# Patient Record
Sex: Female | Born: 1970
Health system: Southern US, Community
[De-identification: ages and names within clinical notes are randomized; demographics above are authoritative.]

## PROBLEM LIST (undated history)

## (undated) DIAGNOSIS — M2550 Pain in unspecified joint: Secondary | ICD-10-CM

## (undated) DIAGNOSIS — Z87898 Personal history of other specified conditions: Secondary | ICD-10-CM

## (undated) DIAGNOSIS — Z8669 Personal history of other diseases of the nervous system and sense organs: Secondary | ICD-10-CM

## (undated) DIAGNOSIS — Z973 Presence of spectacles and contact lenses: Secondary | ICD-10-CM

## (undated) DIAGNOSIS — R200 Anesthesia of skin: Secondary | ICD-10-CM

## (undated) DIAGNOSIS — J45909 Unspecified asthma, uncomplicated: Secondary | ICD-10-CM

## (undated) DIAGNOSIS — Z8619 Personal history of other infectious and parasitic diseases: Secondary | ICD-10-CM

## (undated) DIAGNOSIS — J302 Other seasonal allergic rhinitis: Secondary | ICD-10-CM

## (undated) DIAGNOSIS — K649 Unspecified hemorrhoids: Secondary | ICD-10-CM

## (undated) DIAGNOSIS — K6289 Other specified diseases of anus and rectum: Secondary | ICD-10-CM

## (undated) DIAGNOSIS — Z9889 Other specified postprocedural states: Secondary | ICD-10-CM

## (undated) DIAGNOSIS — D649 Anemia, unspecified: Secondary | ICD-10-CM

## (undated) DIAGNOSIS — R112 Nausea with vomiting, unspecified: Secondary | ICD-10-CM

## (undated) DIAGNOSIS — L719 Rosacea, unspecified: Secondary | ICD-10-CM

## (undated) DIAGNOSIS — Z98811 Dental restoration status: Secondary | ICD-10-CM

## (undated) HISTORY — DX: Anesthesia of skin: R20.0

## (undated) HISTORY — PX: WISDOM TOOTH EXTRACTION: SHX21

## (undated) HISTORY — PX: RETINAL LASER PROCEDURE: SHX2339

---

## 1996-01-15 DIAGNOSIS — Z8619 Personal history of other infectious and parasitic diseases: Secondary | ICD-10-CM

## 1996-01-15 HISTORY — DX: Personal history of other infectious and parasitic diseases: Z86.19

## 2002-02-09 ENCOUNTER — Other Ambulatory Visit: Admission: RE | Admit: 2002-02-09 | Discharge: 2002-02-09 | Payer: Self-pay | Admitting: Obstetrics and Gynecology

## 2002-03-03 ENCOUNTER — Encounter: Payer: Self-pay | Admitting: General Surgery

## 2002-03-03 ENCOUNTER — Encounter: Admission: RE | Admit: 2002-03-03 | Discharge: 2002-03-03 | Payer: Self-pay | Admitting: General Surgery

## 2002-12-08 ENCOUNTER — Other Ambulatory Visit: Admission: RE | Admit: 2002-12-08 | Discharge: 2002-12-08 | Payer: Self-pay | Admitting: Obstetrics and Gynecology

## 2003-07-01 ENCOUNTER — Encounter (INDEPENDENT_AMBULATORY_CARE_PROVIDER_SITE_OTHER): Payer: Self-pay

## 2003-07-01 ENCOUNTER — Inpatient Hospital Stay (HOSPITAL_COMMUNITY): Admission: AD | Admit: 2003-07-01 | Discharge: 2003-07-03 | Payer: Self-pay | Admitting: Obstetrics and Gynecology

## 2003-09-07 ENCOUNTER — Other Ambulatory Visit: Admission: RE | Admit: 2003-09-07 | Discharge: 2003-09-07 | Payer: Self-pay | Admitting: Obstetrics and Gynecology

## 2006-02-18 ENCOUNTER — Encounter: Admission: RE | Admit: 2006-02-18 | Discharge: 2006-02-18 | Payer: Self-pay | Admitting: Otolaryngology

## 2007-05-15 ENCOUNTER — Emergency Department (HOSPITAL_COMMUNITY): Admission: EM | Admit: 2007-05-15 | Discharge: 2007-05-15 | Payer: Self-pay | Admitting: Family Medicine

## 2009-12-12 ENCOUNTER — Encounter: Admission: RE | Admit: 2009-12-12 | Discharge: 2009-12-12 | Payer: Self-pay | Admitting: Obstetrics and Gynecology

## 2010-12-14 HISTORY — PX: DILATION AND CURETTAGE OF UTERUS: SHX78

## 2011-02-06 ENCOUNTER — Other Ambulatory Visit: Payer: Self-pay | Admitting: Obstetrics and Gynecology

## 2011-02-06 ENCOUNTER — Ambulatory Visit (HOSPITAL_COMMUNITY)
Admission: RE | Admit: 2011-02-06 | Payer: BC Managed Care – PPO | Source: Ambulatory Visit | Admitting: Obstetrics and Gynecology

## 2011-02-06 ENCOUNTER — Ambulatory Visit (HOSPITAL_BASED_OUTPATIENT_CLINIC_OR_DEPARTMENT_OTHER)
Admission: RE | Admit: 2011-02-06 | Discharge: 2011-02-06 | Disposition: A | Discharge status: Home / Self Care | Payer: BC Managed Care – PPO | Source: Ambulatory Visit | Attending: Obstetrics and Gynecology | Admitting: Obstetrics and Gynecology

## 2011-02-06 DIAGNOSIS — N949 Unspecified condition associated with female genital organs and menstrual cycle: Secondary | ICD-10-CM | POA: Insufficient documentation

## 2011-02-06 DIAGNOSIS — N938 Other specified abnormal uterine and vaginal bleeding: Secondary | ICD-10-CM | POA: Insufficient documentation

## 2011-02-06 LAB — CBC
MCH: 30.9 pg (ref 26.0–34.0)
MCHC: 33.1 g/dL (ref 30.0–36.0)
MCV: 93.5 fL (ref 78.0–100.0)
Platelets: 190 10*3/uL (ref 150–400)
RBC: 4.01 MIL/uL (ref 3.87–5.11)
RDW: 12.4 % (ref 11.5–15.5)

## 2011-02-06 LAB — DIFFERENTIAL
Basophils Relative: 0 % (ref 0–1)
Eosinophils Absolute: 0.1 10*3/uL (ref 0.0–0.7)
Eosinophils Relative: 1 % (ref 0–5)
Lymphs Abs: 2 10*3/uL (ref 0.7–4.0)
Monocytes Absolute: 0.5 10*3/uL (ref 0.1–1.0)
Monocytes Relative: 5 % (ref 3–12)

## 2011-02-16 ENCOUNTER — Other Ambulatory Visit: Payer: Self-pay | Admitting: Obstetrics and Gynecology

## 2011-02-16 DIAGNOSIS — Z1231 Encounter for screening mammogram for malignant neoplasm of breast: Secondary | ICD-10-CM

## 2011-04-27 ENCOUNTER — Ambulatory Visit
Admission: RE | Admit: 2011-04-27 | Discharge: 2011-04-27 | Disposition: A | Discharge status: Home / Self Care | Payer: BC Managed Care – PPO | Source: Ambulatory Visit | Attending: Obstetrics and Gynecology | Admitting: Obstetrics and Gynecology

## 2011-04-27 DIAGNOSIS — Z1231 Encounter for screening mammogram for malignant neoplasm of breast: Secondary | ICD-10-CM

## 2011-06-22 NOTE — Op Note (Signed)
  Aimee Cook, Aimee Cook             ACCOUNT NO.:  [id number]  MEDICAL RECORD NO.:  [id number]           PATIENT TYPE:  O  LOCATION:  SDC                           FACILITY:  [facility]  PHYSICIAN:  Maxie Better, M.D.DATE OF BIRTH:  [birth year]  DATE OF PROCEDURE:  02/06/2011 DATE OF DISCHARGE:                              OPERATIVE REPORT   PREOPERATIVE DIAGNOSIS:  Dysfunctional uterine bleeding.  PROCEDURE:  Diagnostic hysteroscopy, D and C.  POSTOPERATIVE DIAGNOSIS:  Dysfunctional uterine bleeding.  ANESTHESIA:  General, paracervical block.  SURGEON:  Maxie Better, M.D..  ASSISTANT:  None.  PROCEDURE:  Under adequate general anesthesia, the patient was placed in the dorsal lithotomy position.  She was sterilely prepped and draped in the usual fashion.  The patient has voided prior to entering the room. Examination under anesthesia revealed an anteflexed normal uterus.  No adnexal masses could be appreciated.  A bivalve speculum was placed in vagina.  The 10 mL of 1% Nesacaine was injected paracervical at the 3 and   9 o'clock position.  The anterior lip of the cervix was grasped with a single-toothed tenaculum.  The cervix was then sterilely dilated to #23 [facility] dilator.  A diagnostic hysteroscope was introduced into the uterine cavity without incident.  Both tubal ostia could be seen.  Just a slight prominent posterior wall endometrial thickening was noted, but otherwise no evidence of endometrial polyps.  The endocervical canal was without lesions.  The hysteroscope was removed.  The cava was then curetted.  The hysteroscope was then reinserted.  This was continued until an adequate sampling particularly of the posterior has been done, at which time, all instruments were then removed from the vagina. Specimen labeled endometrial curetting was sent to pathology.  Estimated blood loss was minimal.  Fluid deficit was 90 mL.  Sponge, instrument counts x2 was correct.   Complications were none.  The patient tolerated the procedure well and was transferred to the recovery room in stable condition.     Maxie Better, M.D.     [place]/MED[facility]  D:  02/06/2011  T:  02/06/2011  Job:  [id number]  Electronically Signed by Nena Jordan Ashla Murph M.D. on 02/10/2011 12:03:53 AM

## 2012-03-21 ENCOUNTER — Other Ambulatory Visit: Payer: Self-pay | Admitting: Obstetrics and Gynecology

## 2012-03-21 DIAGNOSIS — Z1231 Encounter for screening mammogram for malignant neoplasm of breast: Secondary | ICD-10-CM

## 2012-04-27 ENCOUNTER — Ambulatory Visit
Admission: RE | Admit: 2012-04-27 | Discharge: 2012-04-27 | Disposition: A | Discharge status: Home / Self Care | Payer: BC Managed Care – PPO | Source: Ambulatory Visit | Attending: Obstetrics and Gynecology | Admitting: Obstetrics and Gynecology

## 2012-04-27 DIAGNOSIS — Z1231 Encounter for screening mammogram for malignant neoplasm of breast: Secondary | ICD-10-CM

## 2012-09-07 DIAGNOSIS — Z23 Encounter for immunization: Secondary | ICD-10-CM

## 2013-03-08 ENCOUNTER — Other Ambulatory Visit: Payer: Self-pay

## 2013-03-08 DIAGNOSIS — Z1231 Encounter for screening mammogram for malignant neoplasm of breast: Secondary | ICD-10-CM

## 2013-03-16 ENCOUNTER — Other Ambulatory Visit: Payer: Self-pay | Admitting: Internal Medicine

## 2013-03-16 DIAGNOSIS — R1011 Right upper quadrant pain: Secondary | ICD-10-CM

## 2013-03-20 ENCOUNTER — Ambulatory Visit
Admission: RE | Admit: 2013-03-20 | Discharge: 2013-03-20 | Disposition: A | Discharge status: Home / Self Care | Payer: BC Managed Care – PPO | Source: Ambulatory Visit | Attending: Internal Medicine | Admitting: Internal Medicine

## 2013-03-20 DIAGNOSIS — R1011 Right upper quadrant pain: Secondary | ICD-10-CM

## 2013-04-28 ENCOUNTER — Ambulatory Visit
Admission: RE | Admit: 2013-04-28 | Discharge: 2013-04-28 | Disposition: A | Discharge status: Home / Self Care | Payer: BC Managed Care – PPO | Source: Ambulatory Visit

## 2013-04-28 DIAGNOSIS — Z1231 Encounter for screening mammogram for malignant neoplasm of breast: Secondary | ICD-10-CM

## 2014-08-29 ENCOUNTER — Ambulatory Visit (INDEPENDENT_AMBULATORY_CARE_PROVIDER_SITE_OTHER): Payer: BC Managed Care – PPO | Admitting: General Surgery

## 2015-03-18 IMAGING — US US ABDOMEN COMPLETE
1 series · 14 of 25 positions shown · non-contrast
Comparison: None.

CLINICAL DATA: Right upper quadrant pain.  Vomiting.

COMPLETE ABDOMINAL ULTRASOUND

[Series 1: us abdomen complete · 0.23mm/px · 14 of 92 slices shown]
[im 1/92]
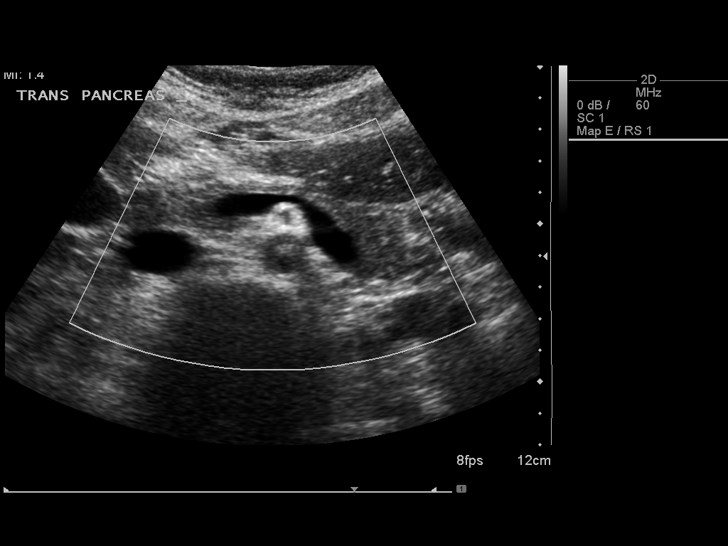
[im 8/92]
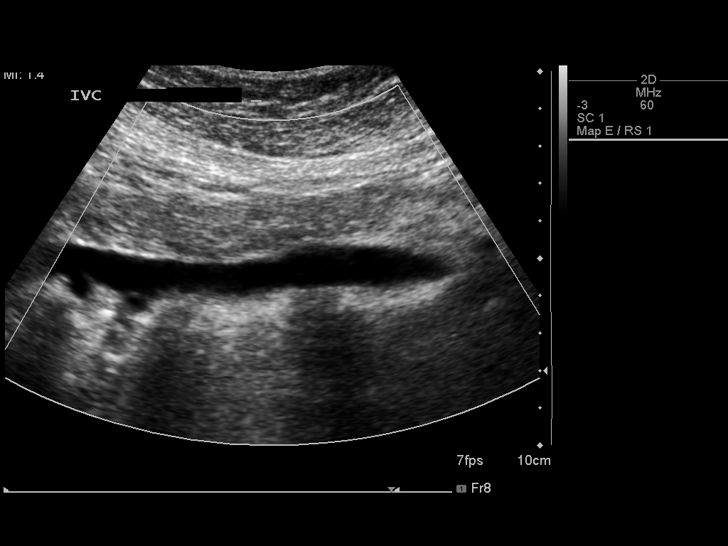
[im 16/92]
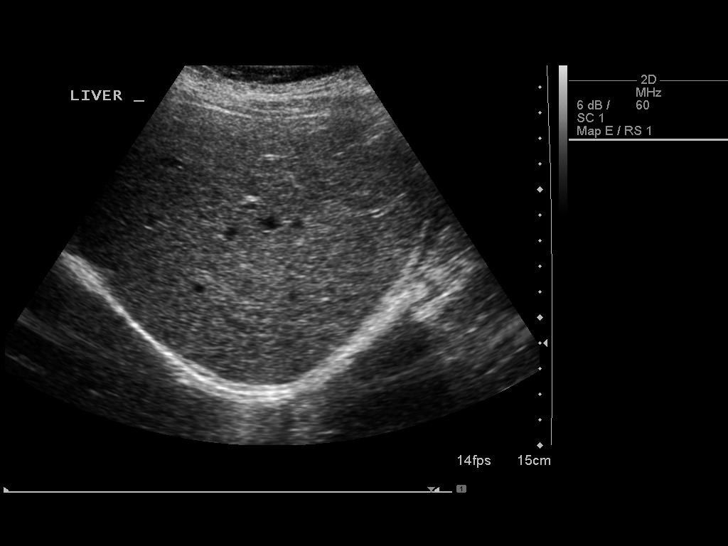
[im 23/92]
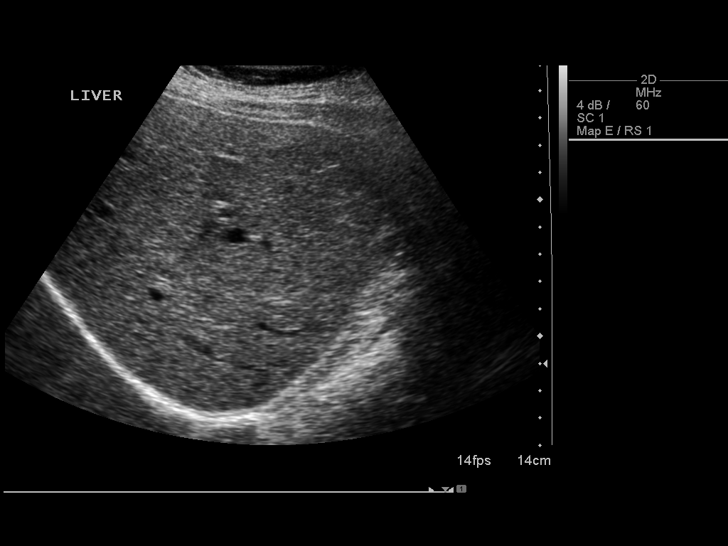
[im 31/92]
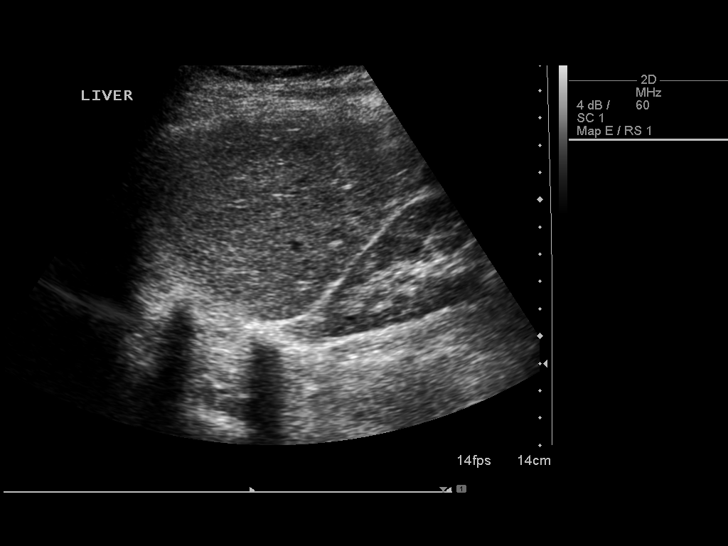
[im 35/92]
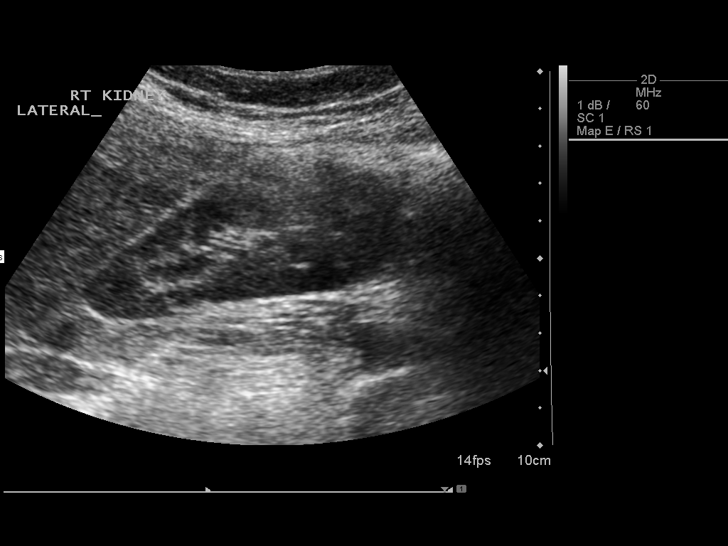
[im 42/92]
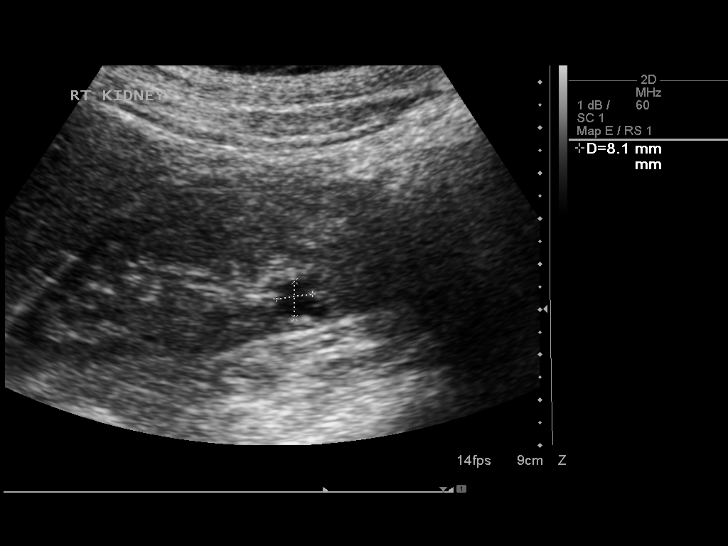
[im 50/92]
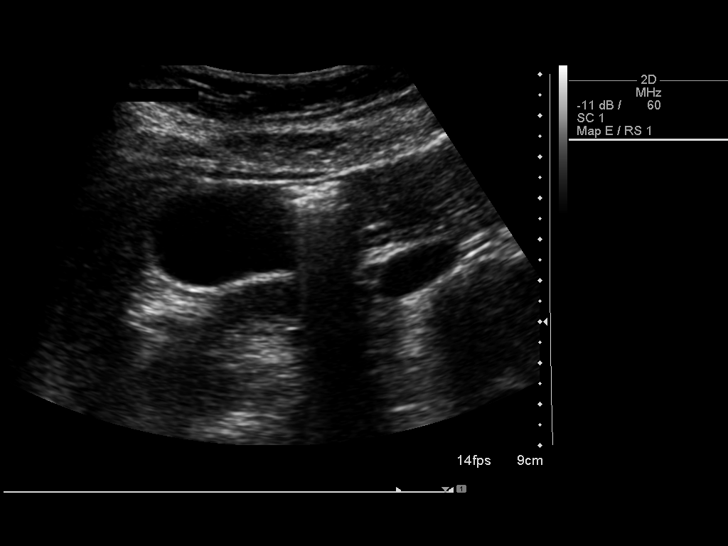
[im 57/92]
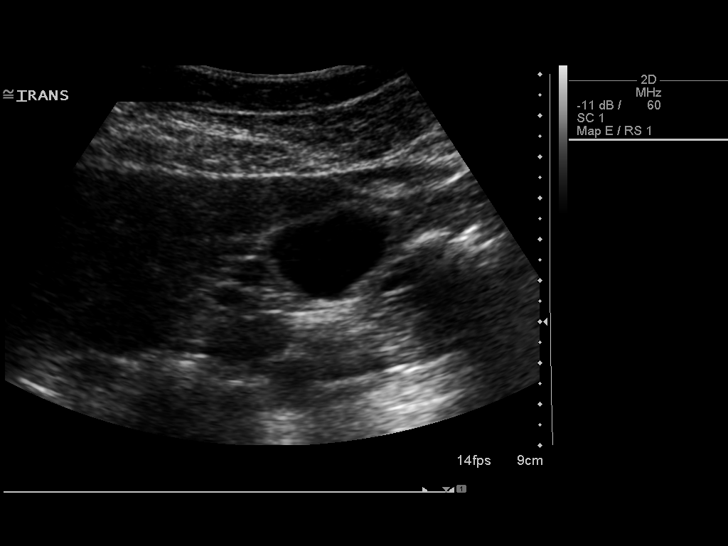
[im 61/92]
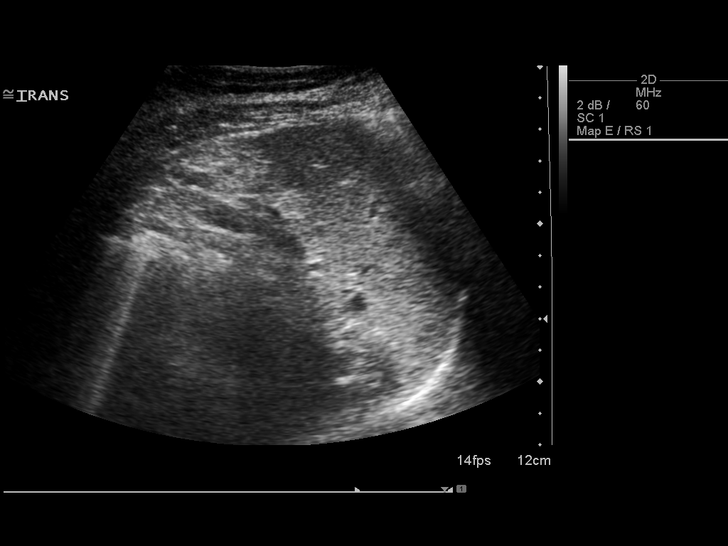
[im 69/92]
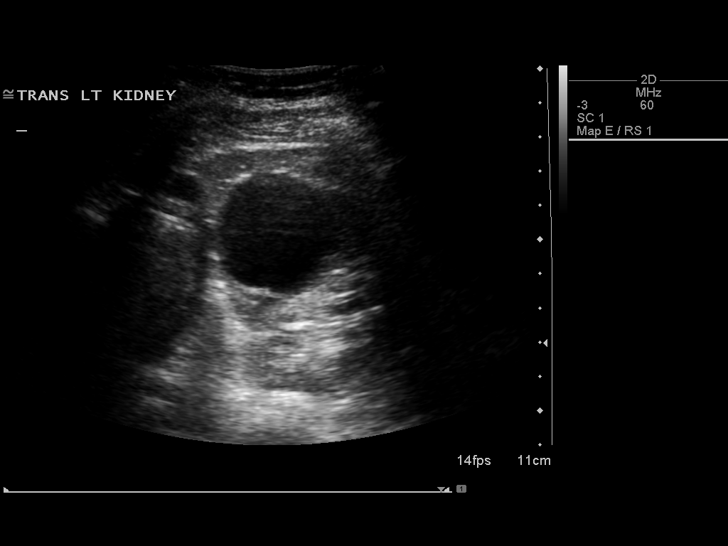
[im 76/92]
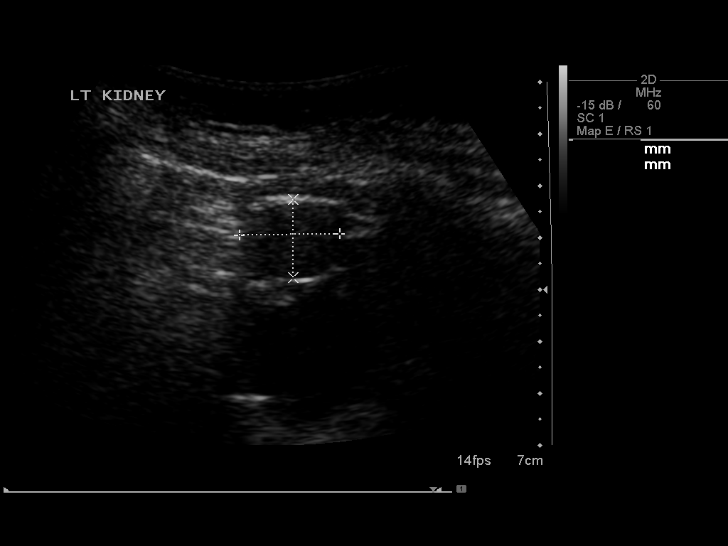
[im 84/92]
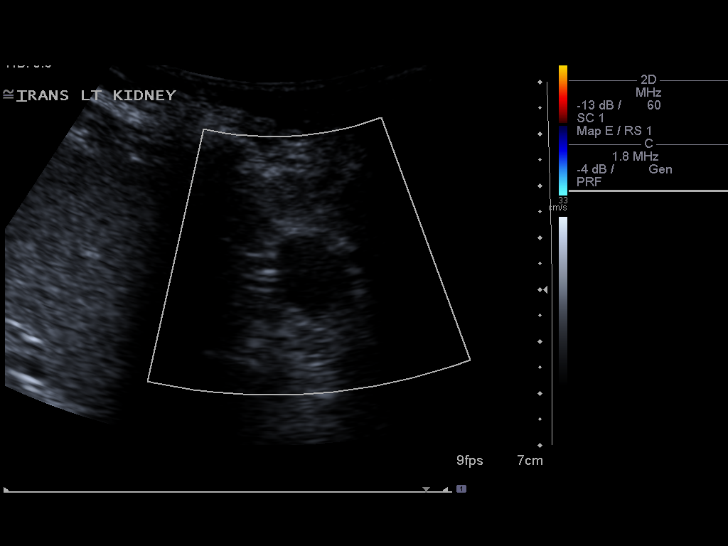
[im 92/92]
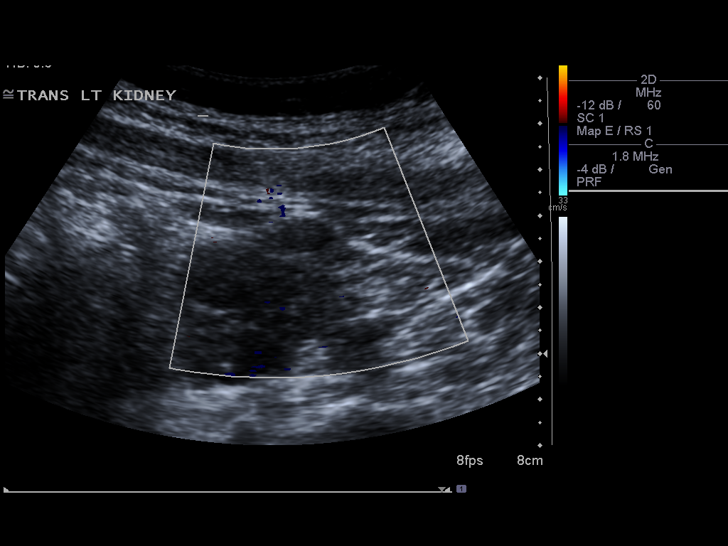

[14 of 25 positions shown; findings below may reference images not displayed]

FINDINGS: Gallbladder:  No gallstones, gallbladder wall thickening, or
pericholecystic fluid. Negative sonographic Murphy's sign.

Common bile duct:  Normal.  2.3 mm in diameter.

Liver:  Normal.

IVC:  Normal.

Pancreas:  Normal.

Spleen:  Normal.  4.6 cm in length.

Right Kidney:  10.3 cm in length.  8 mm cyst in the lower pole.

Left Kidney:  12.0 cm in length.  Multiple cysts including a 4.4 cm
cyst in the upper to midportion with focal calcification in the
posterior wall.  1.9 cm cyst anteriorly in the mid kidney.  2.1 cm
cyst in the mid kidney.

Abdominal aorta:  Normal.  1.8 cm maximum diameter.
IMPRESSION: No acute abnormalities.  Renal cysts.  One small calcification in
the upper pole cyst on the left.

## 2015-10-11 ENCOUNTER — Other Ambulatory Visit: Payer: Self-pay | Admitting: Neurological Surgery

## 2015-11-12 ENCOUNTER — Encounter (HOSPITAL_COMMUNITY): Payer: Self-pay

## 2015-11-12 ENCOUNTER — Encounter (HOSPITAL_COMMUNITY)
Admission: RE | Admit: 2015-11-12 | Discharge: 2015-11-12 | Disposition: A | Discharge status: Home / Self Care | Payer: BLUE CROSS/BLUE SHIELD | Source: Ambulatory Visit | Attending: Neurological Surgery | Admitting: Neurological Surgery

## 2015-11-12 DIAGNOSIS — Z01812 Encounter for preprocedural laboratory examination: Secondary | ICD-10-CM | POA: Insufficient documentation

## 2015-11-12 DIAGNOSIS — M4316 Spondylolisthesis, lumbar region: Secondary | ICD-10-CM | POA: Insufficient documentation

## 2015-11-12 DIAGNOSIS — Z0183 Encounter for blood typing: Secondary | ICD-10-CM | POA: Diagnosis not present

## 2015-11-12 HISTORY — DX: Presence of spectacles and contact lenses: Z97.3

## 2015-11-12 HISTORY — DX: Rosacea, unspecified: L71.9

## 2015-11-12 HISTORY — DX: Unspecified asthma, uncomplicated: J45.909

## 2015-11-12 HISTORY — DX: Dental restoration status: Z98.811

## 2015-11-12 HISTORY — DX: Other seasonal allergic rhinitis: J30.2

## 2015-11-12 HISTORY — DX: Anemia, unspecified: D64.9

## 2015-11-12 HISTORY — DX: Pain in unspecified joint: M25.50

## 2015-11-12 HISTORY — DX: Personal history of other infectious and parasitic diseases: Z86.19

## 2015-11-12 HISTORY — DX: Unspecified hemorrhoids: K64.9

## 2015-11-12 LAB — CBC
HCT: 38.5 % (ref 36.0–46.0)
Hemoglobin: 12.9 g/dL (ref 12.0–15.0)
MCH: 31.4 pg (ref 26.0–34.0)
MCHC: 33.5 g/dL (ref 30.0–36.0)
MCV: 93.7 fL (ref 78.0–100.0)
PLATELETS: 185 10*3/uL (ref 150–400)
RBC: 4.11 MIL/uL (ref 3.87–5.11)
RDW: 12.3 % (ref 11.5–15.5)
WBC: 5.4 10*3/uL (ref 4.0–10.5)

## 2015-11-12 LAB — TYPE AND SCREEN
ABO/RH(D): O POS
Antibody Screen: NEGATIVE

## 2015-11-12 LAB — BASIC METABOLIC PANEL
ANION GAP: 6 (ref 5–15)
BUN: 5 mg/dL — ABNORMAL LOW (ref 6–20)
CALCIUM: 10.1 mg/dL (ref 8.9–10.3)
CO2: 28 mmol/L (ref 22–32)
CREATININE: 0.61 mg/dL (ref 0.44–1.00)
Chloride: 106 mmol/L (ref 101–111)
GFR calc Af Amer: >60 mL/min (ref >60)
GLUCOSE: 82 mg/dL (ref 65–99)
Potassium: 3.7 mmol/L (ref 3.5–5.1)
Sodium: 140 mmol/L (ref 135–145)

## 2015-11-12 LAB — SURGICAL PCR SCREEN
MRSA, PCR: NEGATIVE
Staphylococcus aureus: NEGATIVE

## 2015-11-12 LAB — HCG, SERUM, QUALITATIVE: PREG SERUM: NEGATIVE

## 2015-11-12 LAB — ABO/RH: ABO/RH(D): O POS

## 2015-11-12 NOTE — Pre-Procedure Instructions (Signed)
    Sherrye PayorJennifer L J Hailu  11/12/2015      [facility] [id number] [place], [place] - [address] AT [facility] [address] [place] [id number] Phone: [phone] Fax: [phone]    Your procedure is scheduled on Monday, December 5th, 2016.  [facility] Admitting at 9:00 A.M.  Call this number if you have problems the morning of surgery:  [phone]   Remember:  Do not eat food or drink liquids after midnight.   Take these medicines the morning of surgery with A SIP OF WATER: Beclomethasone (QVAR) inhaler, Restasis eye drop, Fluticasone (Flonase) if needed.  Stop taking: Aspirin, NSAIDS, Aleve, Naproxen, Ibuprofen, Motrin, Advil, BC's, Goody's, fish oil, all herbal medications, and all vitamins.    Do not wear jewelry, make-up or nail polish.  Do not wear lotions, powders, or perfumes.  You may NOT wear deodorant.  Do not shave 48 hours prior to surgery.    [facility] is not responsible for any belongings or valuables.  Contacts, dentures or bridgework may not be worn into surgery.  Leave your suitcase in the car.  After surgery it may be brought to your room.  For patients admitted to the hospital, discharge time will be determined by your treatment team.  Patients discharged the day of surgery will not be allowed to drive home.   Special instructions:  See attached.   Please read over the following fact sheets that you were given. Pain Booklet, Coughing and Deep Breathing, Blood Transfusion Information, MRSA Information and Surgical Site Infection Prevention

## 2015-11-12 NOTE — Progress Notes (Signed)
PCP -Dr. Timothy Lassousso Cardiologist - denies  EKG/ CXR - denies  Echo/stress test/cardiac cath - denies  Patient denies chest pain and shortness of breath at PAT appointment.

## 2015-11-14 HISTORY — PX: SPINAL FUSION: SHX223

## 2015-11-24 MED ORDER — CEFAZOLIN SODIUM-DEXTROSE 2-3 GM-% IV SOLR
2.0000 g | INTRAVENOUS | Status: AC
  Administered 2015-11-25: 2 g via INTRAVENOUS
  Filled 2015-11-24: qty 50

## 2015-11-25 ENCOUNTER — Inpatient Hospital Stay (HOSPITAL_COMMUNITY): Payer: BLUE CROSS/BLUE SHIELD

## 2015-11-25 ENCOUNTER — Inpatient Hospital Stay (HOSPITAL_COMMUNITY): Payer: BLUE CROSS/BLUE SHIELD | Admitting: Anesthesiology

## 2015-11-25 ENCOUNTER — Encounter (HOSPITAL_COMMUNITY): Payer: Self-pay | Admitting: *Deleted

## 2015-11-25 ENCOUNTER — Encounter (HOSPITAL_COMMUNITY)
Admission: RE | Disposition: A | Discharge status: Home / Self Care | Payer: BLUE CROSS/BLUE SHIELD | Source: Ambulatory Visit | Attending: Neurological Surgery

## 2015-11-25 ENCOUNTER — Inpatient Hospital Stay (HOSPITAL_COMMUNITY)
Admission: RE | Admit: 2015-11-25 | Discharge: 2015-11-27 | DRG: 460 | Disposition: A | Discharge status: Home / Self Care | Payer: BLUE CROSS/BLUE SHIELD | Source: Ambulatory Visit | Attending: Neurological Surgery | Admitting: Neurological Surgery

## 2015-11-25 DIAGNOSIS — Q763 Congenital scoliosis due to congenital bony malformation: Secondary | ICD-10-CM

## 2015-11-25 DIAGNOSIS — M4316 Spondylolisthesis, lumbar region: Secondary | ICD-10-CM | POA: Diagnosis present

## 2015-11-25 DIAGNOSIS — J45909 Unspecified asthma, uncomplicated: Secondary | ICD-10-CM | POA: Diagnosis present

## 2015-11-25 DIAGNOSIS — Z419 Encounter for procedure for purposes other than remedying health state, unspecified: Secondary | ICD-10-CM

## 2015-11-25 DIAGNOSIS — Q762 Congenital spondylolisthesis: Principal | ICD-10-CM

## 2015-11-25 DIAGNOSIS — Z888 Allergy status to other drugs, medicaments and biological substances status: Secondary | ICD-10-CM | POA: Diagnosis not present

## 2015-11-25 DIAGNOSIS — Z886 Allergy status to analgesic agent status: Secondary | ICD-10-CM | POA: Diagnosis not present

## 2015-11-25 DIAGNOSIS — M5416 Radiculopathy, lumbar region: Secondary | ICD-10-CM | POA: Diagnosis present

## 2015-11-25 DIAGNOSIS — Z883 Allergy status to other anti-infective agents status: Secondary | ICD-10-CM | POA: Diagnosis not present

## 2015-11-25 SURGERY — POSTERIOR LUMBAR FUSION 1 LEVEL
Anesthesia: General | Site: Back

## 2015-11-25 MED ORDER — CYCLOSPORINE 0.05 % OP EMUL
1.0000 [drp] | Freq: Two times a day (BID) | OPHTHALMIC | Status: DC
  Filled 2015-11-25 (×5): qty 1

## 2015-11-25 MED ORDER — PHENYLEPHRINE 40 MCG/ML (10ML) SYRINGE FOR IV PUSH (FOR BLOOD PRESSURE SUPPORT)
PREFILLED_SYRINGE | INTRAVENOUS | Status: AC
  Filled 2015-11-25: qty 10

## 2015-11-25 MED ORDER — THROMBIN 20000 UNITS EX SOLR
CUTANEOUS | Status: DC | PRN
  Administered 2015-11-25: 18:00:00 via TOPICAL

## 2015-11-25 MED ORDER — ONDANSETRON HCL 4 MG/2ML IJ SOLN
4.0000 mg | INTRAMUSCULAR | Status: DC | PRN
  Administered 2015-11-25 – 2015-11-26 (×3): 4 mg via INTRAVENOUS
  Filled 2015-11-25 (×3): qty 2

## 2015-11-25 MED ORDER — DEXAMETHASONE SODIUM PHOSPHATE 4 MG/ML IJ SOLN
INTRAMUSCULAR | Status: AC
  Filled 2015-11-25: qty 2

## 2015-11-25 MED ORDER — MENTHOL 3 MG MT LOZG: 1.0000 | LOZENGE | OROMUCOSAL | Status: DC | PRN

## 2015-11-25 MED ORDER — MIDAZOLAM HCL 2 MG/2ML IJ SOLN
INTRAMUSCULAR | Status: AC
  Filled 2015-11-25: qty 2

## 2015-11-25 MED ORDER — HYDROMORPHONE HCL 1 MG/ML IJ SOLN
INTRAMUSCULAR | Status: AC
  Administered 2015-11-25: 0.5 mg via INTRAVENOUS
  Filled 2015-11-25: qty 1

## 2015-11-25 MED ORDER — KETOROLAC TROMETHAMINE 15 MG/ML IJ SOLN
15.0000 mg | Freq: Four times a day (QID) | INTRAMUSCULAR | Status: AC
  Administered 2015-11-26 (×5): 15 mg via INTRAVENOUS
  Filled 2015-11-25 (×4): qty 1

## 2015-11-25 MED ORDER — HYDROMORPHONE HCL 1 MG/ML IJ SOLN
0.5000 mg | INTRAMUSCULAR | Status: DC | PRN
  Administered 2015-11-26: 1 mg via INTRAVENOUS
  Filled 2015-11-25: qty 1

## 2015-11-25 MED ORDER — LIDOCAINE HCL (CARDIAC) 20 MG/ML IV SOLN
INTRAVENOUS | Status: DC | PRN
  Administered 2015-11-25: 60 mg via INTRAVENOUS

## 2015-11-25 MED ORDER — FENTANYL CITRATE (PF) 250 MCG/5ML IJ SOLN
INTRAMUSCULAR | Status: AC
  Filled 2015-11-25: qty 5

## 2015-11-25 MED ORDER — RISAQUAD PO CAPS
1.0000 | ORAL_CAPSULE | Freq: Every day | ORAL | Status: DC
  Filled 2015-11-25 (×3): qty 1

## 2015-11-25 MED ORDER — ACETAMINOPHEN 650 MG RE SUPP: 650.0000 mg | RECTAL | Status: DC | PRN

## 2015-11-25 MED ORDER — ROCURONIUM BROMIDE 100 MG/10ML IV SOLN
INTRAVENOUS | Status: DC | PRN
  Administered 2015-11-25: 50 mg via INTRAVENOUS

## 2015-11-25 MED ORDER — METHOCARBAMOL 1000 MG/10ML IJ SOLN
500.0000 mg | Freq: Four times a day (QID) | INTRAMUSCULAR | Status: DC | PRN
  Filled 2015-11-25: qty 5

## 2015-11-25 MED ORDER — ONDANSETRON HCL 4 MG/2ML IJ SOLN
INTRAMUSCULAR | Status: DC | PRN
  Administered 2015-11-25 (×2): 4 mg via INTRAVENOUS

## 2015-11-25 MED ORDER — CEFAZOLIN SODIUM 1-5 GM-% IV SOLN
1.0000 g | Freq: Three times a day (TID) | INTRAVENOUS | Status: AC
  Administered 2015-11-25 – 2015-11-26 (×2): 1 g via INTRAVENOUS
  Filled 2015-11-25 (×2): qty 50

## 2015-11-25 MED ORDER — LACTATED RINGERS IV SOLN
INTRAVENOUS | Status: DC
  Administered 2015-11-25 (×3): via INTRAVENOUS

## 2015-11-25 MED ORDER — BISACODYL 10 MG RE SUPP: 10.0000 mg | Freq: Every day | RECTAL | Status: DC | PRN

## 2015-11-25 MED ORDER — PROBIOTIC PO CAPS: ORAL_CAPSULE | Freq: Every day | ORAL | Status: DC

## 2015-11-25 MED ORDER — 0.9 % SODIUM CHLORIDE (POUR BTL) OPTIME
TOPICAL | Status: DC | PRN
  Administered 2015-11-25: 1000 mL

## 2015-11-25 MED ORDER — SODIUM CHLORIDE 0.9 % IV SOLN: 250.0000 mL | INTRAVENOUS | Status: DC

## 2015-11-25 MED ORDER — DEXAMETHASONE SODIUM PHOSPHATE 10 MG/ML IJ SOLN
INTRAMUSCULAR | Status: DC | PRN
  Administered 2015-11-25: 10 mg via INTRAVENOUS

## 2015-11-25 MED ORDER — SODIUM CHLORIDE 0.9 % IJ SOLN
3.0000 mL | Freq: Two times a day (BID) | INTRAMUSCULAR | Status: DC
  Administered 2015-11-26 (×2): 3 mL via INTRAVENOUS

## 2015-11-25 MED ORDER — ALUM & MAG HYDROXIDE-SIMETH 200-200-20 MG/5ML PO SUSP: 30.0000 mL | Freq: Four times a day (QID) | ORAL | Status: DC | PRN

## 2015-11-25 MED ORDER — SODIUM CHLORIDE 0.9 % IR SOLN
Status: DC | PRN
  Administered 2015-11-25: 18:00:00

## 2015-11-25 MED ORDER — MIDAZOLAM HCL 5 MG/5ML IJ SOLN
INTRAMUSCULAR | Status: DC | PRN
  Administered 2015-11-25: 2 mg via INTRAVENOUS

## 2015-11-25 MED ORDER — ROCURONIUM BROMIDE 50 MG/5ML IV SOLN
INTRAVENOUS | Status: AC
  Filled 2015-11-25: qty 1

## 2015-11-25 MED ORDER — SODIUM CHLORIDE 0.9 % IJ SOLN: 3.0000 mL | INTRAMUSCULAR | Status: DC | PRN

## 2015-11-25 MED ORDER — ONDANSETRON HCL 4 MG/2ML IJ SOLN
INTRAMUSCULAR | Status: AC
  Filled 2015-11-25: qty 2

## 2015-11-25 MED ORDER — CALCIUM POLYCARBOPHIL 625 MG PO TABS
1250.0000 mg | ORAL_TABLET | Freq: Every day | ORAL | Status: DC
  Filled 2015-11-25 (×3): qty 2

## 2015-11-25 MED ORDER — LIDOCAINE-EPINEPHRINE 1 %-1:100000 IJ SOLN
INTRAMUSCULAR | Status: DC | PRN
  Administered 2015-11-25: 10 mL

## 2015-11-25 MED ORDER — OXYCODONE-ACETAMINOPHEN 5-325 MG PO TABS
1.0000 | ORAL_TABLET | ORAL | Status: DC | PRN
  Administered 2015-11-25 – 2015-11-27 (×7): 2 via ORAL
  Filled 2015-11-25 (×8): qty 2

## 2015-11-25 MED ORDER — LIDOCAINE HCL (CARDIAC) 20 MG/ML IV SOLN
INTRAVENOUS | Status: AC
  Filled 2015-11-25: qty 5

## 2015-11-25 MED ORDER — TACROLIMUS 0.1 % EX OINT
1.0000 "application " | TOPICAL_OINTMENT | Freq: Every day | CUTANEOUS | Status: DC
  Administered 2015-11-25: 1 via TOPICAL

## 2015-11-25 MED ORDER — BUDESONIDE 0.25 MG/2ML IN SUSP
0.2500 mg | Freq: Two times a day (BID) | RESPIRATORY_TRACT | Status: DC
Start: 2015-11-25 — End: 2015-11-27
  Filled 2015-11-25 (×6): qty 2

## 2015-11-25 MED ORDER — FLUTICASONE PROPIONATE 50 MCG/ACT NA SUSP
1.0000 | Freq: Every day | NASAL | Status: DC | PRN
  Filled 2015-11-25: qty 16

## 2015-11-25 MED ORDER — PHENYLEPHRINE HCL 10 MG/ML IJ SOLN
INTRAMUSCULAR | Status: DC | PRN
  Administered 2015-11-25 (×2): 40 ug via INTRAVENOUS
  Administered 2015-11-25 (×2): 80 ug via INTRAVENOUS

## 2015-11-25 MED ORDER — THROMBIN 5000 UNITS EX SOLR
OROMUCOSAL | Status: DC | PRN
  Administered 2015-11-25: 18:00:00 via TOPICAL

## 2015-11-25 MED ORDER — DOCUSATE SODIUM 100 MG PO CAPS
100.0000 mg | ORAL_CAPSULE | Freq: Two times a day (BID) | ORAL | Status: DC
  Administered 2015-11-25 – 2015-11-26 (×2): 100 mg via ORAL
  Filled 2015-11-25 (×2): qty 1

## 2015-11-25 MED ORDER — ACETAMINOPHEN 325 MG PO TABS: 650.0000 mg | ORAL_TABLET | ORAL | Status: DC | PRN

## 2015-11-25 MED ORDER — FENTANYL CITRATE (PF) 100 MCG/2ML IJ SOLN
INTRAMUSCULAR | Status: DC | PRN
  Administered 2015-11-25: 25 ug via INTRAVENOUS
  Administered 2015-11-25: 100 ug via INTRAVENOUS

## 2015-11-25 MED ORDER — HYDROCODONE-ACETAMINOPHEN 5-325 MG PO TABS: 1.0000 | ORAL_TABLET | ORAL | Status: DC | PRN

## 2015-11-25 MED ORDER — POLYETHYLENE GLYCOL 3350 17 G PO PACK: 17.0000 g | PACK | Freq: Every day | ORAL | Status: DC | PRN

## 2015-11-25 MED ORDER — PHENOL 1.4 % MT LIQD: 1.0000 | OROMUCOSAL | Status: DC | PRN

## 2015-11-25 MED ORDER — SENNA 8.6 MG PO TABS
1.0000 | ORAL_TABLET | Freq: Two times a day (BID) | ORAL | Status: DC
  Administered 2015-11-25 – 2015-11-26 (×2): 8.6 mg via ORAL
  Filled 2015-11-25 (×2): qty 1

## 2015-11-25 MED ORDER — PROPOFOL 10 MG/ML IV BOLUS
INTRAVENOUS | Status: AC
  Filled 2015-11-25: qty 20

## 2015-11-25 MED ORDER — SODIUM CHLORIDE 0.9 % IV SOLN: INTRAVENOUS | Status: DC

## 2015-11-25 MED ORDER — METHOCARBAMOL 500 MG PO TABS
500.0000 mg | ORAL_TABLET | Freq: Four times a day (QID) | ORAL | Status: DC | PRN
  Administered 2015-11-25 – 2015-11-27 (×5): 500 mg via ORAL
  Filled 2015-11-25 (×5): qty 1

## 2015-11-25 MED ORDER — BUPIVACAINE HCL (PF) 0.5 % IJ SOLN
INTRAMUSCULAR | Status: DC | PRN
  Administered 2015-11-25: 20 mL
  Administered 2015-11-25: 10 mL

## 2015-11-25 MED ORDER — HYDROMORPHONE HCL 1 MG/ML IJ SOLN
0.2500 mg | INTRAMUSCULAR | Status: DC | PRN
  Administered 2015-11-25 (×2): 0.5 mg via INTRAVENOUS

## 2015-11-25 MED ORDER — PROPOFOL 10 MG/ML IV BOLUS
INTRAVENOUS | Status: DC | PRN
  Administered 2015-11-25: 120 mg via INTRAVENOUS

## 2015-11-25 SURGICAL SUPPLY — 66 items
BAG DECANTER FOR FLEXI CONT (MISCELLANEOUS) ×2 IMPLANT
BLADE CLIPPER SURG (BLADE) IMPLANT
BONE CANC CHIPS 40CC CAN1/2 (Bone Implant) ×2 IMPLANT
BUR MATCHSTICK NEURO 3.0 LAGG (BURR) ×2 IMPLANT
CAGE COROENT LRG 9X9X28-8 (Cage) ×4 IMPLANT
CANISTER SUCT 3000ML PPV (MISCELLANEOUS) ×2 IMPLANT
CHIPS CANC BONE 40CC CAN1/2 (Bone Implant) ×1 IMPLANT
CONT SPEC 4OZ CLIKSEAL STRL BL (MISCELLANEOUS) ×2 IMPLANT
COVER BACK TABLE 60X90IN (DRAPES) ×2 IMPLANT
DECANTER SPIKE VIAL GLASS SM (MISCELLANEOUS) ×2 IMPLANT
DERMABOND ADVANCED (GAUZE/BANDAGES/DRESSINGS) ×1
DERMABOND ADVANCED .7 DNX12 (GAUZE/BANDAGES/DRESSINGS) ×1 IMPLANT
DEVICE THRD PEDIGUARD 2.5MM XS (MISCELLANEOUS) ×1 IMPLANT
DRAPE C-ARM 42X72 X-RAY (DRAPES) ×4 IMPLANT
DRAPE LAPAROTOMY 100X72X124 (DRAPES) ×2 IMPLANT
DRAPE POUCH INSTRU U-SHP 10X18 (DRAPES) ×2 IMPLANT
DRAPE PROXIMA HALF (DRAPES) IMPLANT
DRSG OPSITE POSTOP 4X6 (GAUZE/BANDAGES/DRESSINGS) ×2 IMPLANT
DURAPREP 26ML APPLICATOR (WOUND CARE) IMPLANT
ELECT REM PT RETURN 9FT ADLT (ELECTROSURGICAL) ×2
ELECTRODE REM PT RTRN 9FT ADLT (ELECTROSURGICAL) ×1 IMPLANT
GAUZE SPONGE 4X4 12PLY STRL (GAUZE/BANDAGES/DRESSINGS) ×2 IMPLANT
GAUZE SPONGE 4X4 16PLY XRAY LF (GAUZE/BANDAGES/DRESSINGS) IMPLANT
GLOVE BIOGEL PI IND STRL 8.5 (GLOVE) ×2 IMPLANT
GLOVE BIOGEL PI INDICATOR 8.5 (GLOVE) ×2
GLOVE ECLIPSE 8.5 STRL (GLOVE) ×4 IMPLANT
GLOVE EXAM NITRILE LRG STRL (GLOVE) IMPLANT
GLOVE EXAM NITRILE MD LF STRL (GLOVE) IMPLANT
GLOVE EXAM NITRILE XL STR (GLOVE) IMPLANT
GLOVE EXAM NITRILE XS STR PU (GLOVE) IMPLANT
GOWN STRL REUS W/ TWL LRG LVL3 (GOWN DISPOSABLE) IMPLANT
GOWN STRL REUS W/ TWL XL LVL3 (GOWN DISPOSABLE) IMPLANT
GOWN STRL REUS W/TWL 2XL LVL3 (GOWN DISPOSABLE) ×4 IMPLANT
GOWN STRL REUS W/TWL LRG LVL3 (GOWN DISPOSABLE)
GOWN STRL REUS W/TWL XL LVL3 (GOWN DISPOSABLE)
HEMOSTAT POWDER KIT SURGIFOAM (HEMOSTASIS) IMPLANT
HEMOSTAT POWDER SURGIFOAM 1G (HEMOSTASIS) ×2 IMPLANT
KIT BASIN OR (CUSTOM PROCEDURE TRAY) ×2 IMPLANT
KIT ROOM TURNOVER OR (KITS) ×2 IMPLANT
MILL MEDIUM DISP (BLADE) ×2 IMPLANT
MODULE POWER NUVASIVE (MISCELLANEOUS) ×1 IMPLANT
NEEDLE HYPO 22GX1.5 SAFETY (NEEDLE) ×2 IMPLANT
NEEDLE SPNL 22GX3.5 QUINCKE BK (NEEDLE) ×2 IMPLANT
NS IRRIG 1000ML POUR BTL (IV SOLUTION) ×2 IMPLANT
PACK LAMINECTOMY NEURO (CUSTOM PROCEDURE TRAY) ×2 IMPLANT
PAD ARMBOARD 7.5X6 YLW CONV (MISCELLANEOUS) ×6 IMPLANT
PATTIES SURGICAL .5 X1 (DISPOSABLE) ×2 IMPLANT
PEDIGUARD CLASSIC 2.5MM XS (MISCELLANEOUS) ×2
POWER MODULE NUVASIVE (MISCELLANEOUS) ×2
ROD RELINE-O LORD 5.5X35MM (Rod) ×4 IMPLANT
SCREW LOCK RELINE 5.5 TULIP (Screw) ×8 IMPLANT
SCREW RELINE-O POLY 6.5X40 (Screw) ×2 IMPLANT
SCREW RELINE-O POLY 6.5X45 (Screw) ×2 IMPLANT
SCREW RELINE-O POLY 6.5X50MM (Screw) ×4 IMPLANT
SPONGE LAP 4X18 X RAY DECT (DISPOSABLE) IMPLANT
SPONGE SURGIFOAM ABS GEL 100 (HEMOSTASIS) ×2 IMPLANT
SUT VIC AB 1 CT1 18XBRD ANBCTR (SUTURE) ×2 IMPLANT
SUT VIC AB 1 CT1 8-18 (SUTURE) ×2
SUT VIC AB 2-0 CP2 18 (SUTURE) ×4 IMPLANT
SUT VIC AB 3-0 SH 8-18 (SUTURE) ×2 IMPLANT
SYR 3ML LL SCALE MARK (SYRINGE) ×8 IMPLANT
TOWEL OR 17X24 6PK STRL BLUE (TOWEL DISPOSABLE) ×2 IMPLANT
TOWEL OR 17X26 10 PK STRL BLUE (TOWEL DISPOSABLE) ×2 IMPLANT
TRAP SPECIMEN MUCOUS 40CC (MISCELLANEOUS) ×2 IMPLANT
TRAY FOLEY W/METER SILVER 14FR (SET/KITS/TRAYS/PACK) ×2 IMPLANT
WATER STERILE IRR 1000ML POUR (IV SOLUTION) ×2 IMPLANT

## 2015-11-25 NOTE — Op Note (Signed)
Date of surgery: 11/25/2015 Preoperative diagnosis: L5-S1 spondylolisthesis with radiculopathy Postoperative diagnosis: Same Surgery: L5-S1 decompression with decompression of L5 and S1 nerve roots, posterior lumbar interbody arthrodesis with peek spacers local autograft and allograft, pedicle screw fixation L5-S1, posterior lateral arthrodesis L5-S1. Surgeon: Barnett AbuHenry Estha Few First Asst.: Cherrie DistanceBenjamin ditty M.D. Anesthesia: Gen. endotracheal Indications: Aimee FermoJennifer Grabbe is 44 year old individual's had significant back and bilateral lower extremity pain she has a grade 2 spondylolisthesis at L5-S1 that she's been aware of for 20 years. She is treated in all manner of conservative interventions and has been diligent with physical therapy and yoga. Despite this her symptoms have continued to worsen. She has had chronic radiculopathy now with severe episodes every several months. She desires to undergo surgical intervention to stabilize her spine.  Procedure: The patient was brought to the operating room supine on a stretcher. After the smooth induction of general endotracheal anesthesia, she was turned prone. The back was prepped with alcohol and DuraPrep and draped in a sterile fashion. A midline incision was created in the lumbosacral region this was carried down to the lumbar dorsal fascia. The fascia was opened on either side of midline, the underlying tissues were exposed. The laminar arch of L5 and the spinous process was immediately evident as being loose. This was dissected and the laminar arch was removed. This allowed good evaluation and decompression of the common dural tube and then the L5 nerve root superiorly and the S1 nerve root inferiorly. Once this was performed the disc space was isolated. A total discectomy was then performed on a bilateral approach removing a substantial quantity of severely degenerated and desiccated disc material though the endplate material still seems somewhat healthy. The  endplates were removed and the bone was curettaged fully. The interspace was then sized for a 10 mm tall 8 lordotic 28 mm long spacer. These were filled with accommodation of the patient's own bone from the laminectomy at L5 in addition allograft in the form of cancellus croutons. A total of 12 mL of bone graft was packed into the interspace along with the 2 cages. Then pedicle entry sites were chosen at L5 and S1. Lateral gutters were decompressed and decorticated and made ready for grafting. Another 9 mL of the allograft autograft mixture was packed into the lateral gutters between the sacral alar and transverse process of L5. Pedicle screws were then placed using 6.5 x 50 mm screws at L5 and 6.5 x 45 mm screws in the sacrum on one side and 6.5 x 40 mm screw on the other side. Short 40 mm rods were used to connect the screw heads together. With the lateral gutters being packed in the area was inspected carefully to make sure that the L5 and the S1 nerve roots were well decompressed and hemostasis in the epidural space was well maintained. Once this was assured final radiographs were obtained in AP and lateral projection and then the retractor was removed and the lumbar dorsal fascia was closed with #1 Vicryls in 20 Vicryls use of subcutaneous tissues and 30 Vicryls subcuticularly. Blood loss is estimated at less than 200 mL. Patient tolerated procedure well.

## 2015-11-25 NOTE — Anesthesia Preprocedure Evaluation (Addendum)
Anesthesia Evaluation  Patient identified by MRN, date of birth, ID band Patient awake    Reviewed: Allergy & Precautions, H&P , NPO status , Patient's Chart, lab work & pertinent test results  Airway Mallampati: II  TM Distance: >3 FB Neck ROM: Full    Dental no notable dental hx. (+) Teeth Intact, Dental Advisory Given   Pulmonary asthma ,    Pulmonary exam normal breath sounds clear to auscultation       Cardiovascular negative cardio ROS   Rhythm:Regular Rate:Normal     Neuro/Psych negative neurological ROS  negative psych ROS   GI/Hepatic negative GI ROS, Neg liver ROS,   Endo/Other  negative endocrine ROS  Renal/GU negative Renal ROS  negative genitourinary   Musculoskeletal   Abdominal   Peds  Hematology negative hematology ROS (+)   Anesthesia Other Findings   Reproductive/Obstetrics negative OB ROS                             Anesthesia Physical Anesthesia Plan  ASA: II  Anesthesia Plan: General   Post-op Pain Management:    Induction: Intravenous  Airway Management Planned: Oral ETT  Additional Equipment:   Intra-op Plan:   Post-operative Plan: Extubation in OR  Informed Consent: I have reviewed the patients History and Physical, chart, labs and discussed the procedure including the risks, benefits and alternatives for the proposed anesthesia with the patient or authorized representative who has indicated his/her understanding and acceptance.   Dental advisory given  Plan Discussed with: CRNA  Anesthesia Plan Comments:         Anesthesia Quick Evaluation  

## 2015-11-25 NOTE — Brief Op Note (Signed)
11/25/2015  8:09 PM  PATIENT:  Sherrye PayorJennifer L J Venier  44 y.o. female  PRE-OPERATIVE DIAGNOSIS:  lumbar spondylolisthesis  POST-OPERATIVE DIAGNOSIS:  lumbar spondylolisthesis  PROCEDURE:  Procedure(s) with comments: Lumbar five to Sacral one posterior lumbar interbody fusion with interbody prosthesis posterior lateral arthrodesis and posterior nonsegmental instrumentation (N/A) - L5S1 posterior lumbar interbody fusion with interbody prosthesis posterior lateral arthrodesis and posteiror nonsegmental instrumentation  SURGEON:  Surgeon(s) and Role:    * Barnett AbuHenry Ariea Rochin, MD - Primary  PHYSICIAN ASSISTANT:   ASSISTANTS: Cherrie DistanceBenjamin ditty M.D.   ANESTHESIA:   general  EBL:  Total I/O In: 800 [I.V.:800] Out: 140 [Urine:20; Blood:120]  BLOOD ADMINISTERED:none  DRAINS: none   LOCAL MEDICATIONS USED:  NONE  SPECIMEN:  No Specimen  DISPOSITION OF SPECIMEN:  N/A  COUNTS:  YES  TOURNIQUET:  * No tourniquets in log *  DICTATION: .Note written in EPIC  PLAN OF CARE: Admit to inpatient   PATIENT DISPOSITION:  PACU - hemodynamically stable.   Delay start of Pharmacological VTE agent (>24hrs) due to surgical blood loss or risk of bleeding: no

## 2015-11-25 NOTE — H&P (Signed)
CHIEF COMPLAINT:                                          Spondylolisthesis and scoliosis with worsening back pain and radiculopathy.  HISTORY OF PRESENT ILLNESS:                     Aimee Cook is a 44 year old, right-handed individual who tells me that she started having back trouble at age 11.  She was told she had some scoliosis and had been seen by Dr. Lunette Stands.  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  She has had episodic back pain and subsequently it was noted that she had a spondylolisthesis at the L5-S1 level.  Over the years this has caused her some varying degrees of problems with intermittent back pain but radiculopathy started a number of years ago.  She notes that when she became pregnant with her daughter she had a severe episode of back pain with lumbar radicular pain that she notes was worse than labor itself.  She got through this with conservative efforts and has not required any narcotic pain medication.  She has been diligent about keeping the health of her spine well with an exercise program including yoga and also a walking regimen.  She notes that when she did get radicular pain it would be eased when she would walk a distance.  She became regular at walking exercises, walking distances of three to five miles.  This past year, however, she notes that there has been a substantial change and worsening, with more frequent episodes of back pain and worsening radicular pain involving mostly the left lower extremity but also now the right side.  She describes individual situations where she has had worsening of pain, the most recent of which was during a trip to [place] when she was standing on a springboard that her husband stepped on, caused her to shift her balance, and caused a severe exacerbation of pain that was both localized in the back and radiated into the legs.  Since that time she has had a  course of oral prednisone which did not seem to help at the time but gave some subsequent relief.  She notes that in the past prednisone would help nearly immediately and its effectiveness has been less so in the more recent past.    IMAGING STUDIES:                                          Because these symptoms have been ongoing and worsening she had an MRI of her lumbar spine performed on August 8th and this study is reviewed in the office today.  It demonstrates that overall she has an amply patent spinal canal throughout the lumbar spine with healthy-looking disks at every level save for L5-S1 where there is a grade II spondylolisthesis.  I note that she has widely patent foramina at every level but L5-S1 where one can see the L5 nerve root flattened in the foramen and being compressed by a redundantly bulging disk from the L5-S1 disk space.  This occurs on both the left side and the right side and appears to be somewhat worse  on the left side than the right side.  Plain x-rays do not accompany her on this visit.  REVIEW OF SYSTEMS:                                    Systems review is notable for some very mild asthma, wearing of glasses, back pain, some food allergies and inhalant allergies on a 14-point review sheet.  PAST MEDICAL HISTORY:                                Past medical history reveals that her general health has been excellent.  She reports no significant medical problems.    . Medications and Allergies:  SHE REPORTS AN ALLERGY TO ASPIRIN AND ERYTHROMYCIN.  Current medications includes Restasis eyedrops, Flonase, allergy shots, Protopic ointment for rosacea, multivitamin, flax seed oil, glucosamine, FiberCon, and Ultraflora probiotics.  SOCIAL HISTORY:                                            She does not smoke, drinks alcohol socially.  PHYSICAL EXAMINATION:                                Height and weight have been stable at 5 feet 5 inches and 130 pounds.  NEUROLOGICAL  EXAMINATION:                       On physical exam briefly today she stands straight and erect without difficulty.  She walks without antalgia and she has good strength in both her tibialis anterior groups.    . Deep Tendon Reflexes - Her reflexes are present and symmetric in the patellae and the Achilles both.  IMPRESSION:                                                   Aimee Cook is a 44 year old individual who has had a grade II spondylolisthesis that has become progressively more symptomatic over time.  At this time she appears neurologically intact; however, she notes that she has had episodes of severe pain with a substantial radicular component.  Having reviewed the findings on the films I noted that this condition has likely been progressive since her teenage years and is now getting to the point where she appears to be threatening the integrity of the L5 nerve root.  At this time, she is neurologically intact but given her history I suspect that ultimately at some point she needs to consider surgical decompression and stabilization of the L5-S1 joint.  I discussed this with her today, demonstrating the surgery on a model with removal of the loose fragment of bone on the back of her spine, decompression of the nerves, placement of spacers into the disk space, bone graft into the disk space and then pedicle screws into the bones of L5 and S1 to hold the vertebrae together to allow them to heal as one.  This is a substantial operation. Typically, the surgical time is about three hours.  Most  individuals are in the hospital for two to three days afterwards.  Healing takes about three months and to fully heal takes upwards of a year; albeit, by three months most individuals are back to their normal and usual activities.  Most individual require some narcotic analgesia for a period of two to four weeks after surgery.  The surgery is not imminent but depends on the progression of her symptoms.  Given her  history over the past number of months and the past few years, at some point she may need to decide when she would like to proceed with the surgery.  In the meantime, we can continue to treat her conservatively and I note that she has not had a course of an epidural steroid injection, which may be of benefit if she has another exacerbation.  At this time, she is admitted for surgery.

## 2015-11-25 NOTE — Transfer of Care (Signed)
Immediate Anesthesia Transfer of Care Note  Patient: Aimee Cook  Procedure(s) Performed: Procedure(s) with comments: Lumbar five to Sacral one posterior lumbar interbody fusion with interbody prosthesis posterior lateral arthrodesis and posterior nonsegmental instrumentation (N/A) - L5S1 posterior lumbar interbody fusion with interbody prosthesis posterior lateral arthrodesis and posteiror nonsegmental instrumentation  Patient Location: PACU  Anesthesia Type:General  Level of Consciousness: awake, alert , oriented and patient cooperative  Airway & Oxygen Therapy: Patient Spontanous Breathing and Patient connected to nasal cannula oxygen  Post-op Assessment: Report given to RN and Post -op Vital signs reviewed and stable  Post vital signs: Reviewed and stable  Last Vitals:  Filed Vitals:   11/25/15 1419  BP: 125/64  Pulse: 86  Temp: 36.8 C  Resp: 18    Complications: No apparent anesthesia complications

## 2015-11-26 MED ORDER — DEXAMETHASONE SODIUM PHOSPHATE 4 MG/ML IJ SOLN
4.0000 mg | Freq: Once | INTRAMUSCULAR | Status: AC
  Administered 2015-11-26: 4 mg via INTRAVENOUS

## 2015-11-26 MED ORDER — ONDANSETRON HCL 4 MG PO TABS: 4.0000 mg | ORAL_TABLET | ORAL | Status: DC | PRN

## 2015-11-26 MED FILL — Sodium Chloride IV Soln 0.9%: INTRAVENOUS | Qty: 1000 | Status: AC

## 2015-11-26 MED FILL — Heparin Sodium (Porcine) Inj 1000 Unit/ML: INTRAMUSCULAR | Qty: 30 | Status: AC

## 2015-11-26 NOTE — Evaluation (Signed)
Physical Therapy Evaluation and Discharge Patient Details Name: Aimee Cook MRN: [id number] DOB: 03/05/1971 Today's Date: 11/26/2015   History of Present Illness  44 yo female s/p L5-s1 PMH: none to report  Clinical Impression  Patient evaluated by Physical Therapy with no further acute PT needs identified. All education has been completed and the patient has no further questions. At the time of PT eval pt was able to perform transfers and ambulation with mod I. Pt negotiated a flight of stairs with supervision for safety. See below for any follow-up Physical Therapy or equipment needs. PT is signing off. Thank you for this referral.     Follow Up Recommendations No PT follow up    Equipment Recommendations  None recommended by PT    Recommendations for Other Services       Precautions / Restrictions Precautions Precautions: Back Precaution Booklet Issued: Yes (comment) Precaution Comments: handout provided and reviewed in detail Required Braces or Orthoses: Spinal Brace Spinal Brace: Lumbar corset;Applied in standing position Restrictions Weight Bearing Restrictions: No      Mobility  Bed Mobility Overal bed mobility: Modified Independent                Transfers Overall transfer level: Modified independent Equipment used: None             General transfer comment: Pt demonstrated proper hand placement and safety awareness.   Ambulation/Gait Ambulation/Gait assistance: Modified independent (Device/Increase time) Ambulation Distance (Feet): 400 Feet Assistive device: None Gait Pattern/deviations: Step-through pattern;Decreased stride length Gait velocity: Decreased Gait velocity interpretation: Below normal speed for age/gender General Gait Details: Slower, guarded gait but overall steady.   Stairs Stairs: Yes Stairs assistance: Min guard Stair Management: One rail Right;No rails;Forwards Number of Stairs: 10 General stair comments: Pt  ascended 3 stairs with HHA to simulate home environment and negotiated the rest of the flight of stairs with R railing. No assist required when pt was using rail.  Wheelchair Mobility    Modified Rankin (Stroke Patients Only)       Balance Overall balance assessment: No apparent balance deficits (not formally assessed)                                           Pertinent Vitals/Pain Pain Assessment: Faces Pain Score: 4  Faces Pain Scale: Hurts even more Pain Location: Incision site Pain Descriptors / Indicators: Operative site guarding;Discomfort Pain Intervention(s): Limited activity within patient's tolerance;Monitored during session;Repositioned    Home Living Family/patient expects to be discharged to:: Private residence Living Arrangements: Spouse/significant other;Children Available Help at Discharge: Family Type of Home: House Home Access: Stairs to enter Entrance Stairs-Rails: None Entrance Stairs-Number of Steps: 3 Home Layout: Two level Home Equipment: None Additional Comments: has walk in shower but prefers the tub shower. educated on never washing over incision site    Prior Function Level of Independence: Independent               Hand Dominance   Dominant Hand: Right    Extremity/Trunk Assessment   Upper Extremity Assessment: Defer to OT evaluation           Lower Extremity Assessment: Overall WFL for tasks assessed      Cervical / Trunk Assessment: Normal  Communication   Communication: No difficulties  Cognition Arousal/Alertness: Awake/alert Behavior During Therapy: WFL for tasks assessed/performed Overall Cognitive Status: Within  Functional Limits for tasks assessed                      General Comments      Exercises        Assessment/Plan    PT Assessment Patent does not need any further PT services  PT Diagnosis Difficulty walking;Acute pain   PT Problem List    PT Treatment Interventions      PT Goals (Current goals can be found in the Care Plan section) Acute Rehab PT Goals PT Goal Formulation: All assessment and education complete, DC therapy    Frequency     Barriers to discharge        Co-evaluation               End of Session Equipment Utilized During Treatment: Back brace Activity Tolerance: Patient tolerated treatment well Patient left: in bed;with call bell/phone within reach;with family/visitor present Nurse Communication: Mobility status         Time: 1213-1226 PT Time Calculation (min) (ACUTE ONLY): 13 min   Charges:   PT Evaluation $Initial PT Evaluation Tier I: 1 Procedure     PT G CodesConni Slipper Dec 04, 2015, 1:54 PM   Conni Slipper, PT, DPT Acute Rehabilitation Services Pager: [phone]

## 2015-11-26 NOTE — Progress Notes (Signed)
Utilization review completed.  

## 2015-11-26 NOTE — Progress Notes (Signed)
Patient ID: Aimee PayorJennifer L J Cook, female   DOB: 12/03/1971, 44 y.o.   MRN: [id number] Vital signs are stable. Motor function is intact in lower extremities Patient is feeling well Continue to mobilize today Patient is allowed to shower.

## 2015-11-26 NOTE — Evaluation (Signed)
Occupational Therapy Evaluation Patient Details Name: Aimee Cook MRN: [id number] DOB: November 07, 1971 Today's Date: 11/26/2015    History of Present Illness 44 yo female s/p L5-s1 PMH: none to report   Clinical Impression   Patient evaluated by Occupational Therapy with no further acute OT needs identified. All education has been completed and the patient has no further questions. See below for any follow-up Occupational Therapy or equipment needs. OT to sign off. Thank you for referral.      Follow Up Recommendations  No OT follow up    Equipment Recommendations  None recommended by OT    Recommendations for Other Services       Precautions / Restrictions Precautions Precautions: Back Precaution Comments: handout provided and reviewed in detail Required Braces or Orthoses: Spinal Brace Spinal Brace: Lumbar corset;Applied in standing position      Mobility Bed Mobility Overal bed mobility: Modified Independent                Transfers Overall transfer level: Modified independent               General transfer comment: able to don brace ( educated on proper alignment of brace)    Balance                                            ADL Overall ADL's : Modified independent                                       General ADL Comments: demonstrates tub transfer, discussed never washing the incision site directly, use of warm water initially with d/c home, sitting with proper alignment and the various surfaces in the home. Pt has 31 yo daughter in the home. Pet care for dog in the home and making sure the dog is not present in pathway when walking.      Vision     Perception     Praxis      Pertinent Vitals/Pain Pain Assessment: 0-10 Pain Score: 4  Pain Location: surgerical  Pain Descriptors / Indicators: Operative site guarding     Hand Dominance Right   Extremity/Trunk Assessment Upper Extremity  Assessment Upper Extremity Assessment: Overall WFL for tasks assessed   Lower Extremity Assessment Lower Extremity Assessment: Overall WFL for tasks assessed       Communication Communication Communication: No difficulties   Cognition Arousal/Alertness: Awake/alert Behavior During Therapy: WFL for tasks assessed/performed Overall Cognitive Status: Within Functional Limits for tasks assessed                     General Comments       Exercises       Shoulder Instructions      Home Living Family/patient expects to be discharged to:: Private residence Living Arrangements: Spouse/significant other;Children Available Help at Discharge: Family Type of Home: House Home Access: Stairs to enter Secretary/administrator of Steps: 3 Entrance Stairs-Rails: None Home Layout: Two level Alternate Level Stairs-Number of Steps: 20   Bathroom Shower/Tub: Tub/shower unit;Walk-in shower   Bathroom Toilet: Standard     Home Equipment: None   Additional Comments: has walk in shower but prefers the tub shower. educated on never washing over incision site      Prior Functioning/Environment Level  of Independence: Independent             OT Diagnosis:     OT Problem List:     OT Treatment/Interventions:      OT Goals(Current goals can be found in the care plan section)    OT Frequency:     Barriers to D/C:            Co-evaluation              End of Session Equipment Utilized During Treatment: Gait belt;Back brace Nurse Communication: Mobility status;Precautions  Activity Tolerance: Patient tolerated treatment well Patient left: in chair;with call bell/phone within reach;with family/visitor present   Time: 1025-1057 OT Time Calculation (min): 32 min Charges:  OT General Charges $OT Visit: 1 Procedure OT Evaluation $Initial OT Evaluation Tier I: 1 Procedure OT Treatments $Self Care/Home Management : 8-22 mins G-Codes:    Boone MasterJones, Aimee Hubert  Cook 11/26/2015, 11:37 AM  Mateo FlowJones, Aimee Cook   OTR/L Pager: [phone]: [phone] Office: [phone] .

## 2015-11-27 MED ORDER — OXYCODONE-ACETAMINOPHEN 5-325 MG PO TABS: 1.0000 | ORAL_TABLET | ORAL | Status: DC | PRN

## 2015-11-27 MED ORDER — DEXAMETHASONE 0.75 MG PO TABS: ORAL_TABLET | ORAL | Status: DC

## 2015-11-27 MED ORDER — DIAZEPAM 5 MG PO TABS: 5.0000 mg | ORAL_TABLET | Freq: Four times a day (QID) | ORAL | Status: DC | PRN

## 2015-11-27 NOTE — Discharge Instructions (Signed)
Wound Care °Leave incision open to air. °You may shower. °Do not scrub directly on incision.  °Do not put any creams, lotions, or ointments on incision. °Activity °Walk each and every day, increasing distance each day. °No lifting greater than 5 lbs.  Avoid excessive neck motion. °No driving for 2 weeks; may ride as a passenger locally. °Wear neck brace at all times except when showering.  If provided soft collar, may wear for comfort unless otherwise instructed. °Diet °Resume your normal diet.  °Return to Work °Will be discussed at you follow up appointment. °Call Your Doctor If Any of These Occur °Redness, drainage, or swelling at the wound.  °Temperature greater than 101 degrees. °Severe pain not relieved by pain medication. °Increased difficulty swallowing. °Incision starts to come apart. °Follow Up Appt °Call today for appointment in 4 weeks ([phone]) or for problems.  If you have any hardware placed in your spine, you will need an x-ray before your appointment. ° °Spinal Fusion °Spinal fusion is a procedure to make 2 or more of the bones in your spinal column (vertebrae) grow together (fuse). This procedure stops movement between the vertebrae and can relieve pain and prevent deformity.  °Spinal fusion is used to treat the following conditions: °· Fractures of the spine. °· Herniated disk (the spongy material [cartilage] between the vertebrae). °· Abnormal curvatures of the spine, such as scoliosis or kyphosis. °· A weak or an unstable spine, caused by infections or tumor. °RISKS AND COMPLICATIONS °Complications associated with spinal fusion are rare, but they can occur. Possible complications include: °· Bleeding. °· Infection near the incision. °· Nerve damage. Signs of nerve damage are back pain, pain in one or both legs, weakness, or numbness. °· Spinal fluid leakage. °· Blood clot in your leg, which can move to your lungs. °· Difficulty controlling urination or bowel movements. °BEFORE THE PROCEDURE °· A  medical evaluation will be done. This will include a physical exam, blood tests, and imaging exams. °· You will talk with an anesthesiologist. This is the person who will be in charge of the anesthesia during the procedure. Spinal fusion usually requires that you are asleep during the procedure (general anesthesia). °· You will need to stop taking certain medicines, particularly those associated with an increased risk of bleeding. Ask your caregiver about changing or stopping your regular medicines. °· If you smoke, you will need to stop at least 2 weeks before the procedure. Smoking can slow down the healing process, especially fusion of the vertebrae, and increase the risk of complications. °· Do not eat or drink anything for at least 8 hours before the procedure. °PROCEDURE  °A cut (incision) is made over the vertebrae that will be fused. The back muscles are separated from the vertebrae. If you are having this procedure to treat a herniated disk, the disc material pressing on the nerve root is removed (decompression). The area where the disk is removed is then filled with extra bone. Bone from another part of your body (autogenous bone) or bone from a bone donor (allograft bone) may be used. The extra bone promotes fusion between the vertebrae. Sometimes, specific medicines are added to the fusion area to promote bone healing. In most cases, screws and rods or metal plates will be used to attach the vertebrae to stabilize them while they fuse.  °AFTER THE PROCEDURE  °· You will stay in a recovery area until the anesthesia has worn off. Your blood pressure and pulse will be checked frequently. °·   You will be given antibiotics to prevent infection. °· You may continue to receive fluids through an intravenous (IV) tube while you are still in the hospital. °· Pain after surgery is normal. You will be given pain medicine. °· You will be taught how to move correctly and how to stand and walk. While in bed, you will be  instructed to turn frequently, using a "log rolling" technique, in which the entire body is moved without twisting the back. °  °This information is not intended to replace advice given to you by your health care provider. Make sure you discuss any questions you have with your health care provider. °  °Document Released: 08/29/2003 Document Revised: 02/22/2012 Document Reviewed: 05/15/2015 °Elsevier Interactive Patient Education ©2016 [employer] ° °

## 2015-11-27 NOTE — Anesthesia Postprocedure Evaluation (Signed)
Anesthesia Post Note  Patient: Aimee Cook  Procedure(s) Performed: Procedure(s) (LRB): Lumbar five to Sacral one posterior lumbar interbody fusion with interbody prosthesis posterior lateral arthrodesis and posterior nonsegmental instrumentation (N/A)  Patient location during evaluation: PACU Anesthesia Type: General Level of consciousness: awake and alert Pain management: pain level controlled Vital Signs Assessment: post-procedure vital signs reviewed and stable Respiratory status: spontaneous breathing, nonlabored ventilation, respiratory function stable and patient connected to nasal cannula oxygen Cardiovascular status: blood pressure returned to baseline and stable Postop Assessment: no signs of nausea or vomiting Anesthetic complications: no    Last Vitals:  Filed Vitals:   11/27/15 0334 11/27/15 0822  BP: 104/50 104/46  Pulse: 75 70  Temp: 36.6 C 36.7 C  Resp: 18 18    Last Pain:  Filed Vitals:   11/27/15 0942  PainSc: 4                  Malyssa Maris,JAMES TERRILL

## 2015-11-27 NOTE — Discharge Summary (Signed)
Physician Discharge Summary  Patient ID: Aimee Cook MRN: [id number] DOB/AGE: January 02, 1971 44 y.o.  Admit date: 11/25/2015 Discharge date: 11/27/2015  Admission Diagnoses: Spondylolisthesis L5-S1 grade 2 with lumbar radiculopathy  Discharge Diagnoses: Spondylolisthesis L5-S1 grade 2 with lumbar radiculopathy  Active Problems:   Congenital spondylolisthesis of lumbar region   Discharged Condition: good  Hospital Course: Patient was omitted to undergo surgical decompression and stabilization at L5-S1. She tolerated surgery well.  Consults: None  Significant Diagnostic Studies: None  Treatments: surgery: Posterior decompression L5 decompression of L5 and S1 nerve roots posterior lateral arthrodesis using local autograft and allograft interbody arthrodesis using peek spacers local autograft and allograft pedicle screw fixation L5-S1  Discharge Exam: Blood pressure 104/46, pulse 70, temperature 98.1 F (36.7 C), temperature source Oral, resp. rate 18, height  (1.651 m), weight 63.186 kg (139 lb 4.8 oz), SpO2 100 %. Incision is clean and dry motor function is intact gait and station are normal  Disposition: 01-Home or Self Care  Discharge Instructions    Call MD for:  redness, tenderness, or signs of infection (pain, swelling, redness, odor or green/yellow discharge around incision site)    Complete by:  As directed      Call MD for:  severe uncontrolled pain    Complete by:  As directed      Call MD for:  temperature >100.4    Complete by:  As directed      Diet - low sodium heart healthy    Complete by:  As directed      Discharge instructions    Complete by:  As directed   Okay to shower. Do not apply salves or appointments to incision. No heavy lifting with the upper extremities greater than 15 pounds. May resume driving when not requiring pain medication and patient feels comfortable with doing so.     Increase activity slowly    Complete by:  As directed              Medication List    TAKE these medications        beclomethasone 80 MCG/ACT inhaler  Commonly known as:  QVAR  Inhale 1 puff into the lungs daily.     cycloSPORINE 0.05 % ophthalmic emulsion  Commonly known as:  RESTASIS  Place 1 drop into both eyes 2 (two) times daily.     dexamethasone 0.75 MG tablet  Commonly known as:  DECADRON  2 tablets twice daily for 2 days, one tablet twice daily for 2 days, one tablet daily for 2 days.     diazepam 5 MG tablet  Commonly known as:  VALIUM  Take 1 tablet (5 mg total) by mouth every 6 (six) hours as needed for anxiety.     Flax Seed Oil 1000 MG Caps  Take 1,000 mg by mouth daily.     fluticasone 50 MCG/ACT nasal spray  Commonly known as:  FLONASE  Place 1 spray into both nostrils daily as needed for allergies or rhinitis.     Glucosamine 750 MG Tabs  Take 750 mg by mouth daily.     ONE-A-DAY WOMENS FORMULA PO  Take 1 tablet by mouth daily.     oxyCODONE-acetaminophen 5-325 MG tablet  Commonly known as:  PERCOCET/ROXICET  Take 1-2 tablets by mouth every 4 (four) hours as needed for moderate pain.     polycarbophil 625 MG tablet  Commonly known as:  FIBERCON  Take 1,250 mg by mouth daily.  PROBIOTIC PO  Take 1 capsule by mouth at bedtime.     tacrolimus 0.1 % ointment  Commonly known as:  PROTOPIC  Apply 1 application topically at bedtime.         SignedStefani Cook: Aimee Cook J 11/27/2015, 9:44 AM

## 2015-11-27 NOTE — Progress Notes (Signed)
Pt and husband given D/C instructions with Rx's, verbal understanding was provided. Pt's IV was removed prior to D/C. Pt's incision is clean and dry with no sign of infection. Pt D/C'd home via wheelchair @ 1140 per MD order. Pt is stable @ D/C and has no other needs at this time. Rema FendtAshley Ludwika Rodd, RN

## 2016-03-16 DIAGNOSIS — J3081 Allergic rhinitis due to animal (cat) (dog) hair and dander: Secondary | ICD-10-CM | POA: Diagnosis not present

## 2016-03-16 DIAGNOSIS — J301 Allergic rhinitis due to pollen: Secondary | ICD-10-CM | POA: Diagnosis not present

## 2016-03-16 DIAGNOSIS — J3089 Other allergic rhinitis: Secondary | ICD-10-CM | POA: Diagnosis not present

## 2016-03-19 DIAGNOSIS — J3089 Other allergic rhinitis: Secondary | ICD-10-CM | POA: Diagnosis not present

## 2016-03-19 DIAGNOSIS — J301 Allergic rhinitis due to pollen: Secondary | ICD-10-CM | POA: Diagnosis not present

## 2016-03-19 DIAGNOSIS — J3081 Allergic rhinitis due to animal (cat) (dog) hair and dander: Secondary | ICD-10-CM | POA: Diagnosis not present

## 2016-04-03 DIAGNOSIS — J3081 Allergic rhinitis due to animal (cat) (dog) hair and dander: Secondary | ICD-10-CM | POA: Diagnosis not present

## 2016-04-03 DIAGNOSIS — J3089 Other allergic rhinitis: Secondary | ICD-10-CM | POA: Diagnosis not present

## 2016-04-03 DIAGNOSIS — J301 Allergic rhinitis due to pollen: Secondary | ICD-10-CM | POA: Diagnosis not present

## 2016-04-09 DIAGNOSIS — J301 Allergic rhinitis due to pollen: Secondary | ICD-10-CM | POA: Diagnosis not present

## 2016-04-09 DIAGNOSIS — J3081 Allergic rhinitis due to animal (cat) (dog) hair and dander: Secondary | ICD-10-CM | POA: Diagnosis not present

## 2016-04-09 DIAGNOSIS — J3089 Other allergic rhinitis: Secondary | ICD-10-CM | POA: Diagnosis not present

## 2016-04-15 DIAGNOSIS — M4317 Spondylolisthesis, lumbosacral region: Secondary | ICD-10-CM | POA: Diagnosis not present

## 2016-04-16 DIAGNOSIS — J3089 Other allergic rhinitis: Secondary | ICD-10-CM | POA: Diagnosis not present

## 2016-04-16 DIAGNOSIS — J3081 Allergic rhinitis due to animal (cat) (dog) hair and dander: Secondary | ICD-10-CM | POA: Diagnosis not present

## 2016-04-16 DIAGNOSIS — J301 Allergic rhinitis due to pollen: Secondary | ICD-10-CM | POA: Diagnosis not present

## 2016-04-24 DIAGNOSIS — J301 Allergic rhinitis due to pollen: Secondary | ICD-10-CM | POA: Diagnosis not present

## 2016-04-24 DIAGNOSIS — J3089 Other allergic rhinitis: Secondary | ICD-10-CM | POA: Diagnosis not present

## 2016-04-24 DIAGNOSIS — J3081 Allergic rhinitis due to animal (cat) (dog) hair and dander: Secondary | ICD-10-CM | POA: Diagnosis not present

## 2016-05-01 DIAGNOSIS — J301 Allergic rhinitis due to pollen: Secondary | ICD-10-CM | POA: Diagnosis not present

## 2016-05-01 DIAGNOSIS — J3081 Allergic rhinitis due to animal (cat) (dog) hair and dander: Secondary | ICD-10-CM | POA: Diagnosis not present

## 2016-05-01 DIAGNOSIS — J3089 Other allergic rhinitis: Secondary | ICD-10-CM | POA: Diagnosis not present

## 2016-05-06 DIAGNOSIS — J301 Allergic rhinitis due to pollen: Secondary | ICD-10-CM | POA: Diagnosis not present

## 2016-05-06 DIAGNOSIS — J3089 Other allergic rhinitis: Secondary | ICD-10-CM | POA: Diagnosis not present

## 2016-05-06 DIAGNOSIS — J3081 Allergic rhinitis due to animal (cat) (dog) hair and dander: Secondary | ICD-10-CM | POA: Diagnosis not present

## 2016-05-15 DIAGNOSIS — J3081 Allergic rhinitis due to animal (cat) (dog) hair and dander: Secondary | ICD-10-CM | POA: Diagnosis not present

## 2016-05-15 DIAGNOSIS — J301 Allergic rhinitis due to pollen: Secondary | ICD-10-CM | POA: Diagnosis not present

## 2016-05-15 DIAGNOSIS — J3089 Other allergic rhinitis: Secondary | ICD-10-CM | POA: Diagnosis not present

## 2016-05-19 DIAGNOSIS — J3081 Allergic rhinitis due to animal (cat) (dog) hair and dander: Secondary | ICD-10-CM | POA: Diagnosis not present

## 2016-05-19 DIAGNOSIS — J301 Allergic rhinitis due to pollen: Secondary | ICD-10-CM | POA: Diagnosis not present

## 2016-05-19 DIAGNOSIS — J3089 Other allergic rhinitis: Secondary | ICD-10-CM | POA: Diagnosis not present

## 2016-05-27 DIAGNOSIS — J301 Allergic rhinitis due to pollen: Secondary | ICD-10-CM | POA: Diagnosis not present

## 2016-05-27 DIAGNOSIS — J3081 Allergic rhinitis due to animal (cat) (dog) hair and dander: Secondary | ICD-10-CM | POA: Diagnosis not present

## 2016-05-27 DIAGNOSIS — J3089 Other allergic rhinitis: Secondary | ICD-10-CM | POA: Diagnosis not present

## 2016-06-01 DIAGNOSIS — Z6822 Body mass index (BMI) 22.0-22.9, adult: Secondary | ICD-10-CM | POA: Diagnosis not present

## 2016-06-01 DIAGNOSIS — Z01419 Encounter for gynecological examination (general) (routine) without abnormal findings: Secondary | ICD-10-CM | POA: Diagnosis not present

## 2016-06-01 DIAGNOSIS — Z1151 Encounter for screening for human papillomavirus (HPV): Secondary | ICD-10-CM | POA: Diagnosis not present

## 2016-06-01 DIAGNOSIS — Z1231 Encounter for screening mammogram for malignant neoplasm of breast: Secondary | ICD-10-CM | POA: Diagnosis not present

## 2016-06-03 DIAGNOSIS — J301 Allergic rhinitis due to pollen: Secondary | ICD-10-CM | POA: Diagnosis not present

## 2016-06-03 DIAGNOSIS — J3089 Other allergic rhinitis: Secondary | ICD-10-CM | POA: Diagnosis not present

## 2016-06-03 DIAGNOSIS — J3081 Allergic rhinitis due to animal (cat) (dog) hair and dander: Secondary | ICD-10-CM | POA: Diagnosis not present

## 2016-06-10 DIAGNOSIS — J301 Allergic rhinitis due to pollen: Secondary | ICD-10-CM | POA: Diagnosis not present

## 2016-06-10 DIAGNOSIS — J3081 Allergic rhinitis due to animal (cat) (dog) hair and dander: Secondary | ICD-10-CM | POA: Diagnosis not present

## 2016-06-10 DIAGNOSIS — J3089 Other allergic rhinitis: Secondary | ICD-10-CM | POA: Diagnosis not present

## 2016-07-14 DIAGNOSIS — L814 Other melanin hyperpigmentation: Secondary | ICD-10-CM | POA: Diagnosis not present

## 2016-07-14 DIAGNOSIS — L821 Other seborrheic keratosis: Secondary | ICD-10-CM | POA: Diagnosis not present

## 2016-07-14 DIAGNOSIS — D1801 Hemangioma of skin and subcutaneous tissue: Secondary | ICD-10-CM | POA: Diagnosis not present

## 2016-07-14 DIAGNOSIS — Z86018 Personal history of other benign neoplasm: Secondary | ICD-10-CM | POA: Diagnosis not present

## 2016-07-15 DIAGNOSIS — J3089 Other allergic rhinitis: Secondary | ICD-10-CM | POA: Diagnosis not present

## 2016-07-15 DIAGNOSIS — J3081 Allergic rhinitis due to animal (cat) (dog) hair and dander: Secondary | ICD-10-CM | POA: Diagnosis not present

## 2016-07-15 DIAGNOSIS — J301 Allergic rhinitis due to pollen: Secondary | ICD-10-CM | POA: Diagnosis not present

## 2016-07-22 DIAGNOSIS — J3089 Other allergic rhinitis: Secondary | ICD-10-CM | POA: Diagnosis not present

## 2016-07-22 DIAGNOSIS — J3081 Allergic rhinitis due to animal (cat) (dog) hair and dander: Secondary | ICD-10-CM | POA: Diagnosis not present

## 2016-07-22 DIAGNOSIS — J301 Allergic rhinitis due to pollen: Secondary | ICD-10-CM | POA: Diagnosis not present

## 2016-07-23 DIAGNOSIS — F4322 Adjustment disorder with anxiety: Secondary | ICD-10-CM | POA: Diagnosis not present

## 2016-07-29 DIAGNOSIS — H33303 Unspecified retinal break, bilateral: Secondary | ICD-10-CM | POA: Diagnosis not present

## 2016-07-29 DIAGNOSIS — J3089 Other allergic rhinitis: Secondary | ICD-10-CM | POA: Diagnosis not present

## 2016-07-29 DIAGNOSIS — J301 Allergic rhinitis due to pollen: Secondary | ICD-10-CM | POA: Diagnosis not present

## 2016-07-29 DIAGNOSIS — H5213 Myopia, bilateral: Secondary | ICD-10-CM | POA: Diagnosis not present

## 2016-07-29 DIAGNOSIS — H04123 Dry eye syndrome of bilateral lacrimal glands: Secondary | ICD-10-CM | POA: Diagnosis not present

## 2016-07-29 DIAGNOSIS — J3081 Allergic rhinitis due to animal (cat) (dog) hair and dander: Secondary | ICD-10-CM | POA: Diagnosis not present

## 2016-08-05 DIAGNOSIS — J301 Allergic rhinitis due to pollen: Secondary | ICD-10-CM | POA: Diagnosis not present

## 2016-08-05 DIAGNOSIS — J3089 Other allergic rhinitis: Secondary | ICD-10-CM | POA: Diagnosis not present

## 2016-08-05 DIAGNOSIS — J3081 Allergic rhinitis due to animal (cat) (dog) hair and dander: Secondary | ICD-10-CM | POA: Diagnosis not present

## 2016-08-06 DIAGNOSIS — J301 Allergic rhinitis due to pollen: Secondary | ICD-10-CM | POA: Diagnosis not present

## 2016-08-06 DIAGNOSIS — J3081 Allergic rhinitis due to animal (cat) (dog) hair and dander: Secondary | ICD-10-CM | POA: Diagnosis not present

## 2016-08-07 DIAGNOSIS — J3089 Other allergic rhinitis: Secondary | ICD-10-CM | POA: Diagnosis not present

## 2016-08-12 DIAGNOSIS — J3089 Other allergic rhinitis: Secondary | ICD-10-CM | POA: Diagnosis not present

## 2016-08-12 DIAGNOSIS — J301 Allergic rhinitis due to pollen: Secondary | ICD-10-CM | POA: Diagnosis not present

## 2016-08-12 DIAGNOSIS — J3081 Allergic rhinitis due to animal (cat) (dog) hair and dander: Secondary | ICD-10-CM | POA: Diagnosis not present

## 2016-08-14 DIAGNOSIS — F4322 Adjustment disorder with anxiety: Secondary | ICD-10-CM | POA: Diagnosis not present

## 2016-08-19 DIAGNOSIS — J3081 Allergic rhinitis due to animal (cat) (dog) hair and dander: Secondary | ICD-10-CM | POA: Diagnosis not present

## 2016-08-19 DIAGNOSIS — J301 Allergic rhinitis due to pollen: Secondary | ICD-10-CM | POA: Diagnosis not present

## 2016-08-19 DIAGNOSIS — J3089 Other allergic rhinitis: Secondary | ICD-10-CM | POA: Diagnosis not present

## 2016-08-24 DIAGNOSIS — F4322 Adjustment disorder with anxiety: Secondary | ICD-10-CM | POA: Diagnosis not present

## 2016-08-26 DIAGNOSIS — J3089 Other allergic rhinitis: Secondary | ICD-10-CM | POA: Diagnosis not present

## 2016-08-26 DIAGNOSIS — J301 Allergic rhinitis due to pollen: Secondary | ICD-10-CM | POA: Diagnosis not present

## 2016-08-26 DIAGNOSIS — J3081 Allergic rhinitis due to animal (cat) (dog) hair and dander: Secondary | ICD-10-CM | POA: Diagnosis not present

## 2016-09-03 DIAGNOSIS — F4322 Adjustment disorder with anxiety: Secondary | ICD-10-CM | POA: Diagnosis not present

## 2016-09-08 DIAGNOSIS — J3089 Other allergic rhinitis: Secondary | ICD-10-CM | POA: Diagnosis not present

## 2016-09-08 DIAGNOSIS — J301 Allergic rhinitis due to pollen: Secondary | ICD-10-CM | POA: Diagnosis not present

## 2016-09-08 DIAGNOSIS — J3081 Allergic rhinitis due to animal (cat) (dog) hair and dander: Secondary | ICD-10-CM | POA: Diagnosis not present

## 2016-09-16 DIAGNOSIS — J3081 Allergic rhinitis due to animal (cat) (dog) hair and dander: Secondary | ICD-10-CM | POA: Diagnosis not present

## 2016-09-16 DIAGNOSIS — J301 Allergic rhinitis due to pollen: Secondary | ICD-10-CM | POA: Diagnosis not present

## 2016-09-16 DIAGNOSIS — J3089 Other allergic rhinitis: Secondary | ICD-10-CM | POA: Diagnosis not present

## 2016-09-18 DIAGNOSIS — F4322 Adjustment disorder with anxiety: Secondary | ICD-10-CM | POA: Diagnosis not present

## 2016-09-23 DIAGNOSIS — Z23 Encounter for immunization: Secondary | ICD-10-CM | POA: Diagnosis not present

## 2016-10-02 DIAGNOSIS — J3089 Other allergic rhinitis: Secondary | ICD-10-CM | POA: Diagnosis not present

## 2016-10-02 DIAGNOSIS — J301 Allergic rhinitis due to pollen: Secondary | ICD-10-CM | POA: Diagnosis not present

## 2016-10-07 DIAGNOSIS — F4322 Adjustment disorder with anxiety: Secondary | ICD-10-CM | POA: Diagnosis not present

## 2016-10-07 DIAGNOSIS — J3089 Other allergic rhinitis: Secondary | ICD-10-CM | POA: Diagnosis not present

## 2016-10-07 DIAGNOSIS — J3081 Allergic rhinitis due to animal (cat) (dog) hair and dander: Secondary | ICD-10-CM | POA: Diagnosis not present

## 2016-10-07 DIAGNOSIS — J301 Allergic rhinitis due to pollen: Secondary | ICD-10-CM | POA: Diagnosis not present

## 2016-10-30 DIAGNOSIS — J301 Allergic rhinitis due to pollen: Secondary | ICD-10-CM | POA: Diagnosis not present

## 2016-10-30 DIAGNOSIS — J3089 Other allergic rhinitis: Secondary | ICD-10-CM | POA: Diagnosis not present

## 2016-10-30 DIAGNOSIS — J3081 Allergic rhinitis due to animal (cat) (dog) hair and dander: Secondary | ICD-10-CM | POA: Diagnosis not present

## 2016-12-28 DIAGNOSIS — J301 Allergic rhinitis due to pollen: Secondary | ICD-10-CM | POA: Diagnosis not present

## 2016-12-28 DIAGNOSIS — J3089 Other allergic rhinitis: Secondary | ICD-10-CM | POA: Diagnosis not present

## 2016-12-28 DIAGNOSIS — J3081 Allergic rhinitis due to animal (cat) (dog) hair and dander: Secondary | ICD-10-CM | POA: Diagnosis not present

## 2017-01-07 DIAGNOSIS — J301 Allergic rhinitis due to pollen: Secondary | ICD-10-CM | POA: Diagnosis not present

## 2017-01-07 DIAGNOSIS — J3089 Other allergic rhinitis: Secondary | ICD-10-CM | POA: Diagnosis not present

## 2017-01-07 DIAGNOSIS — J3081 Allergic rhinitis due to animal (cat) (dog) hair and dander: Secondary | ICD-10-CM | POA: Diagnosis not present

## 2017-01-13 DIAGNOSIS — J3089 Other allergic rhinitis: Secondary | ICD-10-CM | POA: Diagnosis not present

## 2017-01-13 DIAGNOSIS — J301 Allergic rhinitis due to pollen: Secondary | ICD-10-CM | POA: Diagnosis not present

## 2017-01-13 DIAGNOSIS — J3081 Allergic rhinitis due to animal (cat) (dog) hair and dander: Secondary | ICD-10-CM | POA: Diagnosis not present

## 2017-01-21 DIAGNOSIS — J301 Allergic rhinitis due to pollen: Secondary | ICD-10-CM | POA: Diagnosis not present

## 2017-01-21 DIAGNOSIS — D2311 Other benign neoplasm of skin of right eyelid, including canthus: Secondary | ICD-10-CM | POA: Diagnosis not present

## 2017-01-21 DIAGNOSIS — J3081 Allergic rhinitis due to animal (cat) (dog) hair and dander: Secondary | ICD-10-CM | POA: Diagnosis not present

## 2017-01-21 DIAGNOSIS — J4530 Mild persistent asthma, uncomplicated: Secondary | ICD-10-CM | POA: Diagnosis not present

## 2017-01-21 DIAGNOSIS — J3089 Other allergic rhinitis: Secondary | ICD-10-CM | POA: Diagnosis not present

## 2017-02-03 DIAGNOSIS — J3089 Other allergic rhinitis: Secondary | ICD-10-CM | POA: Diagnosis not present

## 2017-02-03 DIAGNOSIS — J301 Allergic rhinitis due to pollen: Secondary | ICD-10-CM | POA: Diagnosis not present

## 2017-02-03 DIAGNOSIS — J3081 Allergic rhinitis due to animal (cat) (dog) hair and dander: Secondary | ICD-10-CM | POA: Diagnosis not present

## 2017-02-11 DIAGNOSIS — J301 Allergic rhinitis due to pollen: Secondary | ICD-10-CM | POA: Diagnosis not present

## 2017-02-11 DIAGNOSIS — J3081 Allergic rhinitis due to animal (cat) (dog) hair and dander: Secondary | ICD-10-CM | POA: Diagnosis not present

## 2017-02-11 DIAGNOSIS — J3089 Other allergic rhinitis: Secondary | ICD-10-CM | POA: Diagnosis not present

## 2017-02-24 DIAGNOSIS — J3081 Allergic rhinitis due to animal (cat) (dog) hair and dander: Secondary | ICD-10-CM | POA: Diagnosis not present

## 2017-02-24 DIAGNOSIS — J301 Allergic rhinitis due to pollen: Secondary | ICD-10-CM | POA: Diagnosis not present

## 2017-02-24 DIAGNOSIS — J3089 Other allergic rhinitis: Secondary | ICD-10-CM | POA: Diagnosis not present

## 2017-03-05 DIAGNOSIS — J3089 Other allergic rhinitis: Secondary | ICD-10-CM | POA: Diagnosis not present

## 2017-03-05 DIAGNOSIS — J3081 Allergic rhinitis due to animal (cat) (dog) hair and dander: Secondary | ICD-10-CM | POA: Diagnosis not present

## 2017-03-05 DIAGNOSIS — J301 Allergic rhinitis due to pollen: Secondary | ICD-10-CM | POA: Diagnosis not present

## 2017-04-20 DIAGNOSIS — H01002 Unspecified blepharitis right lower eyelid: Secondary | ICD-10-CM | POA: Diagnosis not present

## 2017-04-21 DIAGNOSIS — J3089 Other allergic rhinitis: Secondary | ICD-10-CM | POA: Diagnosis not present

## 2017-04-21 DIAGNOSIS — J301 Allergic rhinitis due to pollen: Secondary | ICD-10-CM | POA: Diagnosis not present

## 2017-04-21 DIAGNOSIS — J3081 Allergic rhinitis due to animal (cat) (dog) hair and dander: Secondary | ICD-10-CM | POA: Diagnosis not present

## 2017-05-07 DIAGNOSIS — J301 Allergic rhinitis due to pollen: Secondary | ICD-10-CM | POA: Diagnosis not present

## 2017-05-07 DIAGNOSIS — J3081 Allergic rhinitis due to animal (cat) (dog) hair and dander: Secondary | ICD-10-CM | POA: Diagnosis not present

## 2017-05-07 DIAGNOSIS — J3089 Other allergic rhinitis: Secondary | ICD-10-CM | POA: Diagnosis not present

## 2017-05-21 DIAGNOSIS — J3089 Other allergic rhinitis: Secondary | ICD-10-CM | POA: Diagnosis not present

## 2017-05-21 DIAGNOSIS — J301 Allergic rhinitis due to pollen: Secondary | ICD-10-CM | POA: Diagnosis not present

## 2017-05-21 DIAGNOSIS — J3081 Allergic rhinitis due to animal (cat) (dog) hair and dander: Secondary | ICD-10-CM | POA: Diagnosis not present

## 2017-05-26 DIAGNOSIS — J3089 Other allergic rhinitis: Secondary | ICD-10-CM | POA: Diagnosis not present

## 2017-05-26 DIAGNOSIS — J301 Allergic rhinitis due to pollen: Secondary | ICD-10-CM | POA: Diagnosis not present

## 2017-05-26 DIAGNOSIS — J3081 Allergic rhinitis due to animal (cat) (dog) hair and dander: Secondary | ICD-10-CM | POA: Diagnosis not present

## 2017-06-02 DIAGNOSIS — J301 Allergic rhinitis due to pollen: Secondary | ICD-10-CM | POA: Diagnosis not present

## 2017-06-02 DIAGNOSIS — J3081 Allergic rhinitis due to animal (cat) (dog) hair and dander: Secondary | ICD-10-CM | POA: Diagnosis not present

## 2017-06-02 DIAGNOSIS — J3089 Other allergic rhinitis: Secondary | ICD-10-CM | POA: Diagnosis not present

## 2017-06-04 DIAGNOSIS — J3089 Other allergic rhinitis: Secondary | ICD-10-CM | POA: Diagnosis not present

## 2017-06-04 DIAGNOSIS — J3081 Allergic rhinitis due to animal (cat) (dog) hair and dander: Secondary | ICD-10-CM | POA: Diagnosis not present

## 2017-06-04 DIAGNOSIS — J301 Allergic rhinitis due to pollen: Secondary | ICD-10-CM | POA: Diagnosis not present

## 2017-06-09 DIAGNOSIS — Z1231 Encounter for screening mammogram for malignant neoplasm of breast: Secondary | ICD-10-CM | POA: Diagnosis not present

## 2017-06-09 DIAGNOSIS — Z1151 Encounter for screening for human papillomavirus (HPV): Secondary | ICD-10-CM | POA: Diagnosis not present

## 2017-06-09 DIAGNOSIS — J3089 Other allergic rhinitis: Secondary | ICD-10-CM | POA: Diagnosis not present

## 2017-06-09 DIAGNOSIS — Z6823 Body mass index (BMI) 23.0-23.9, adult: Secondary | ICD-10-CM | POA: Diagnosis not present

## 2017-06-09 DIAGNOSIS — J3081 Allergic rhinitis due to animal (cat) (dog) hair and dander: Secondary | ICD-10-CM | POA: Diagnosis not present

## 2017-06-09 DIAGNOSIS — J301 Allergic rhinitis due to pollen: Secondary | ICD-10-CM | POA: Diagnosis not present

## 2017-06-09 DIAGNOSIS — Z01419 Encounter for gynecological examination (general) (routine) without abnormal findings: Secondary | ICD-10-CM | POA: Diagnosis not present

## 2017-06-11 DIAGNOSIS — J301 Allergic rhinitis due to pollen: Secondary | ICD-10-CM | POA: Diagnosis not present

## 2017-06-11 DIAGNOSIS — J3081 Allergic rhinitis due to animal (cat) (dog) hair and dander: Secondary | ICD-10-CM | POA: Diagnosis not present

## 2017-06-11 DIAGNOSIS — J3089 Other allergic rhinitis: Secondary | ICD-10-CM | POA: Diagnosis not present

## 2017-06-14 ENCOUNTER — Other Ambulatory Visit: Payer: Self-pay | Admitting: Obstetrics and Gynecology

## 2017-06-14 DIAGNOSIS — N644 Mastodynia: Secondary | ICD-10-CM

## 2017-06-18 ENCOUNTER — Ambulatory Visit
Admission: RE | Admit: 2017-06-18 | Discharge: 2017-06-18 | Disposition: A | Discharge status: Home / Self Care | Payer: BLUE CROSS/BLUE SHIELD | Source: Ambulatory Visit | Attending: Obstetrics and Gynecology | Admitting: Obstetrics and Gynecology

## 2017-06-18 DIAGNOSIS — N644 Mastodynia: Secondary | ICD-10-CM

## 2017-06-18 DIAGNOSIS — N6489 Other specified disorders of breast: Secondary | ICD-10-CM | POA: Diagnosis not present

## 2017-06-23 DIAGNOSIS — J3081 Allergic rhinitis due to animal (cat) (dog) hair and dander: Secondary | ICD-10-CM | POA: Diagnosis not present

## 2017-06-23 DIAGNOSIS — J301 Allergic rhinitis due to pollen: Secondary | ICD-10-CM | POA: Diagnosis not present

## 2017-06-23 DIAGNOSIS — J3089 Other allergic rhinitis: Secondary | ICD-10-CM | POA: Diagnosis not present

## 2017-06-30 DIAGNOSIS — J3089 Other allergic rhinitis: Secondary | ICD-10-CM | POA: Diagnosis not present

## 2017-06-30 DIAGNOSIS — J3081 Allergic rhinitis due to animal (cat) (dog) hair and dander: Secondary | ICD-10-CM | POA: Diagnosis not present

## 2017-06-30 DIAGNOSIS — J301 Allergic rhinitis due to pollen: Secondary | ICD-10-CM | POA: Diagnosis not present

## 2017-07-21 DIAGNOSIS — J3081 Allergic rhinitis due to animal (cat) (dog) hair and dander: Secondary | ICD-10-CM | POA: Diagnosis not present

## 2017-07-21 DIAGNOSIS — J301 Allergic rhinitis due to pollen: Secondary | ICD-10-CM | POA: Diagnosis not present

## 2017-07-21 DIAGNOSIS — J3089 Other allergic rhinitis: Secondary | ICD-10-CM | POA: Diagnosis not present

## 2017-07-28 DIAGNOSIS — J301 Allergic rhinitis due to pollen: Secondary | ICD-10-CM | POA: Diagnosis not present

## 2017-07-28 DIAGNOSIS — J3081 Allergic rhinitis due to animal (cat) (dog) hair and dander: Secondary | ICD-10-CM | POA: Diagnosis not present

## 2017-07-28 DIAGNOSIS — J3089 Other allergic rhinitis: Secondary | ICD-10-CM | POA: Diagnosis not present

## 2017-07-29 DIAGNOSIS — J301 Allergic rhinitis due to pollen: Secondary | ICD-10-CM | POA: Diagnosis not present

## 2017-07-29 DIAGNOSIS — J3081 Allergic rhinitis due to animal (cat) (dog) hair and dander: Secondary | ICD-10-CM | POA: Diagnosis not present

## 2017-07-30 DIAGNOSIS — H04123 Dry eye syndrome of bilateral lacrimal glands: Secondary | ICD-10-CM | POA: Diagnosis not present

## 2017-07-30 DIAGNOSIS — H5213 Myopia, bilateral: Secondary | ICD-10-CM | POA: Diagnosis not present

## 2017-07-30 DIAGNOSIS — J3089 Other allergic rhinitis: Secondary | ICD-10-CM | POA: Diagnosis not present

## 2017-08-04 DIAGNOSIS — J3081 Allergic rhinitis due to animal (cat) (dog) hair and dander: Secondary | ICD-10-CM | POA: Diagnosis not present

## 2017-08-04 DIAGNOSIS — J301 Allergic rhinitis due to pollen: Secondary | ICD-10-CM | POA: Diagnosis not present

## 2017-08-04 DIAGNOSIS — J3089 Other allergic rhinitis: Secondary | ICD-10-CM | POA: Diagnosis not present

## 2017-08-13 DIAGNOSIS — J301 Allergic rhinitis due to pollen: Secondary | ICD-10-CM | POA: Diagnosis not present

## 2017-08-13 DIAGNOSIS — J3081 Allergic rhinitis due to animal (cat) (dog) hair and dander: Secondary | ICD-10-CM | POA: Diagnosis not present

## 2017-08-13 DIAGNOSIS — J3089 Other allergic rhinitis: Secondary | ICD-10-CM | POA: Diagnosis not present

## 2017-08-19 DIAGNOSIS — J301 Allergic rhinitis due to pollen: Secondary | ICD-10-CM | POA: Diagnosis not present

## 2017-08-19 DIAGNOSIS — J3089 Other allergic rhinitis: Secondary | ICD-10-CM | POA: Diagnosis not present

## 2017-08-19 DIAGNOSIS — J3081 Allergic rhinitis due to animal (cat) (dog) hair and dander: Secondary | ICD-10-CM | POA: Diagnosis not present

## 2017-08-27 DIAGNOSIS — J301 Allergic rhinitis due to pollen: Secondary | ICD-10-CM | POA: Diagnosis not present

## 2017-08-27 DIAGNOSIS — J3089 Other allergic rhinitis: Secondary | ICD-10-CM | POA: Diagnosis not present

## 2017-08-27 DIAGNOSIS — J3081 Allergic rhinitis due to animal (cat) (dog) hair and dander: Secondary | ICD-10-CM | POA: Diagnosis not present

## 2017-09-01 DIAGNOSIS — J3089 Other allergic rhinitis: Secondary | ICD-10-CM | POA: Diagnosis not present

## 2017-09-01 DIAGNOSIS — J3081 Allergic rhinitis due to animal (cat) (dog) hair and dander: Secondary | ICD-10-CM | POA: Diagnosis not present

## 2017-09-01 DIAGNOSIS — J301 Allergic rhinitis due to pollen: Secondary | ICD-10-CM | POA: Diagnosis not present

## 2017-09-29 DIAGNOSIS — J301 Allergic rhinitis due to pollen: Secondary | ICD-10-CM | POA: Diagnosis not present

## 2017-09-29 DIAGNOSIS — J3089 Other allergic rhinitis: Secondary | ICD-10-CM | POA: Diagnosis not present

## 2017-09-29 DIAGNOSIS — J3081 Allergic rhinitis due to animal (cat) (dog) hair and dander: Secondary | ICD-10-CM | POA: Diagnosis not present

## 2017-10-08 DIAGNOSIS — J3089 Other allergic rhinitis: Secondary | ICD-10-CM | POA: Diagnosis not present

## 2017-10-08 DIAGNOSIS — J3081 Allergic rhinitis due to animal (cat) (dog) hair and dander: Secondary | ICD-10-CM | POA: Diagnosis not present

## 2017-10-08 DIAGNOSIS — J301 Allergic rhinitis due to pollen: Secondary | ICD-10-CM | POA: Diagnosis not present

## 2017-10-22 DIAGNOSIS — J301 Allergic rhinitis due to pollen: Secondary | ICD-10-CM | POA: Diagnosis not present

## 2017-10-22 DIAGNOSIS — J3089 Other allergic rhinitis: Secondary | ICD-10-CM | POA: Diagnosis not present

## 2017-10-22 DIAGNOSIS — J3081 Allergic rhinitis due to animal (cat) (dog) hair and dander: Secondary | ICD-10-CM | POA: Diagnosis not present

## 2017-10-29 DIAGNOSIS — J301 Allergic rhinitis due to pollen: Secondary | ICD-10-CM | POA: Diagnosis not present

## 2017-10-29 DIAGNOSIS — J3081 Allergic rhinitis due to animal (cat) (dog) hair and dander: Secondary | ICD-10-CM | POA: Diagnosis not present

## 2017-10-29 DIAGNOSIS — J3089 Other allergic rhinitis: Secondary | ICD-10-CM | POA: Diagnosis not present

## 2017-11-10 DIAGNOSIS — J3081 Allergic rhinitis due to animal (cat) (dog) hair and dander: Secondary | ICD-10-CM | POA: Diagnosis not present

## 2017-11-10 DIAGNOSIS — J301 Allergic rhinitis due to pollen: Secondary | ICD-10-CM | POA: Diagnosis not present

## 2017-11-10 DIAGNOSIS — J3089 Other allergic rhinitis: Secondary | ICD-10-CM | POA: Diagnosis not present

## 2017-11-17 DIAGNOSIS — J3089 Other allergic rhinitis: Secondary | ICD-10-CM | POA: Diagnosis not present

## 2017-11-17 DIAGNOSIS — J301 Allergic rhinitis due to pollen: Secondary | ICD-10-CM | POA: Diagnosis not present

## 2017-11-17 DIAGNOSIS — J3081 Allergic rhinitis due to animal (cat) (dog) hair and dander: Secondary | ICD-10-CM | POA: Diagnosis not present

## 2017-11-24 DIAGNOSIS — J3089 Other allergic rhinitis: Secondary | ICD-10-CM | POA: Diagnosis not present

## 2017-11-24 DIAGNOSIS — J301 Allergic rhinitis due to pollen: Secondary | ICD-10-CM | POA: Diagnosis not present

## 2017-11-24 DIAGNOSIS — J3081 Allergic rhinitis due to animal (cat) (dog) hair and dander: Secondary | ICD-10-CM | POA: Diagnosis not present

## 2017-12-03 DIAGNOSIS — J3089 Other allergic rhinitis: Secondary | ICD-10-CM | POA: Diagnosis not present

## 2017-12-03 DIAGNOSIS — J3081 Allergic rhinitis due to animal (cat) (dog) hair and dander: Secondary | ICD-10-CM | POA: Diagnosis not present

## 2017-12-03 DIAGNOSIS — J301 Allergic rhinitis due to pollen: Secondary | ICD-10-CM | POA: Diagnosis not present

## 2017-12-10 DIAGNOSIS — J301 Allergic rhinitis due to pollen: Secondary | ICD-10-CM | POA: Diagnosis not present

## 2017-12-10 DIAGNOSIS — J3081 Allergic rhinitis due to animal (cat) (dog) hair and dander: Secondary | ICD-10-CM | POA: Diagnosis not present

## 2017-12-10 DIAGNOSIS — J3089 Other allergic rhinitis: Secondary | ICD-10-CM | POA: Diagnosis not present

## 2017-12-13 DIAGNOSIS — J3089 Other allergic rhinitis: Secondary | ICD-10-CM | POA: Diagnosis not present

## 2017-12-13 DIAGNOSIS — J301 Allergic rhinitis due to pollen: Secondary | ICD-10-CM | POA: Diagnosis not present

## 2017-12-29 DIAGNOSIS — J301 Allergic rhinitis due to pollen: Secondary | ICD-10-CM | POA: Diagnosis not present

## 2017-12-29 DIAGNOSIS — J3081 Allergic rhinitis due to animal (cat) (dog) hair and dander: Secondary | ICD-10-CM | POA: Diagnosis not present

## 2017-12-29 DIAGNOSIS — J3089 Other allergic rhinitis: Secondary | ICD-10-CM | POA: Diagnosis not present

## 2018-01-12 DIAGNOSIS — J3089 Other allergic rhinitis: Secondary | ICD-10-CM | POA: Diagnosis not present

## 2018-01-12 DIAGNOSIS — J3081 Allergic rhinitis due to animal (cat) (dog) hair and dander: Secondary | ICD-10-CM | POA: Diagnosis not present

## 2018-01-12 DIAGNOSIS — J301 Allergic rhinitis due to pollen: Secondary | ICD-10-CM | POA: Diagnosis not present

## 2018-01-19 DIAGNOSIS — J301 Allergic rhinitis due to pollen: Secondary | ICD-10-CM | POA: Diagnosis not present

## 2018-01-19 DIAGNOSIS — J3081 Allergic rhinitis due to animal (cat) (dog) hair and dander: Secondary | ICD-10-CM | POA: Diagnosis not present

## 2018-01-19 DIAGNOSIS — J3089 Other allergic rhinitis: Secondary | ICD-10-CM | POA: Diagnosis not present

## 2018-01-27 DIAGNOSIS — J301 Allergic rhinitis due to pollen: Secondary | ICD-10-CM | POA: Diagnosis not present

## 2018-01-27 DIAGNOSIS — J3081 Allergic rhinitis due to animal (cat) (dog) hair and dander: Secondary | ICD-10-CM | POA: Diagnosis not present

## 2018-01-27 DIAGNOSIS — J3089 Other allergic rhinitis: Secondary | ICD-10-CM | POA: Diagnosis not present

## 2018-01-28 DIAGNOSIS — H43813 Vitreous degeneration, bilateral: Secondary | ICD-10-CM | POA: Diagnosis not present

## 2018-01-28 DIAGNOSIS — H43392 Other vitreous opacities, left eye: Secondary | ICD-10-CM | POA: Diagnosis not present

## 2018-02-02 DIAGNOSIS — J3081 Allergic rhinitis due to animal (cat) (dog) hair and dander: Secondary | ICD-10-CM | POA: Diagnosis not present

## 2018-02-02 DIAGNOSIS — J3089 Other allergic rhinitis: Secondary | ICD-10-CM | POA: Diagnosis not present

## 2018-02-02 DIAGNOSIS — J301 Allergic rhinitis due to pollen: Secondary | ICD-10-CM | POA: Diagnosis not present

## 2018-02-03 DIAGNOSIS — J3089 Other allergic rhinitis: Secondary | ICD-10-CM | POA: Diagnosis not present

## 2018-02-03 DIAGNOSIS — J301 Allergic rhinitis due to pollen: Secondary | ICD-10-CM | POA: Diagnosis not present

## 2018-02-03 DIAGNOSIS — J4530 Mild persistent asthma, uncomplicated: Secondary | ICD-10-CM | POA: Diagnosis not present

## 2018-02-03 DIAGNOSIS — J3081 Allergic rhinitis due to animal (cat) (dog) hair and dander: Secondary | ICD-10-CM | POA: Diagnosis not present

## 2018-02-09 DIAGNOSIS — J3081 Allergic rhinitis due to animal (cat) (dog) hair and dander: Secondary | ICD-10-CM | POA: Diagnosis not present

## 2018-02-09 DIAGNOSIS — J3089 Other allergic rhinitis: Secondary | ICD-10-CM | POA: Diagnosis not present

## 2018-02-09 DIAGNOSIS — J301 Allergic rhinitis due to pollen: Secondary | ICD-10-CM | POA: Diagnosis not present

## 2018-03-02 DIAGNOSIS — J3089 Other allergic rhinitis: Secondary | ICD-10-CM | POA: Diagnosis not present

## 2018-03-02 DIAGNOSIS — J301 Allergic rhinitis due to pollen: Secondary | ICD-10-CM | POA: Diagnosis not present

## 2018-03-02 DIAGNOSIS — J3081 Allergic rhinitis due to animal (cat) (dog) hair and dander: Secondary | ICD-10-CM | POA: Diagnosis not present

## 2018-03-11 DIAGNOSIS — H43392 Other vitreous opacities, left eye: Secondary | ICD-10-CM | POA: Diagnosis not present

## 2018-03-11 DIAGNOSIS — H2513 Age-related nuclear cataract, bilateral: Secondary | ICD-10-CM | POA: Diagnosis not present

## 2018-03-11 DIAGNOSIS — H43813 Vitreous degeneration, bilateral: Secondary | ICD-10-CM | POA: Diagnosis not present

## 2018-03-16 DIAGNOSIS — J301 Allergic rhinitis due to pollen: Secondary | ICD-10-CM | POA: Diagnosis not present

## 2018-03-16 DIAGNOSIS — J3081 Allergic rhinitis due to animal (cat) (dog) hair and dander: Secondary | ICD-10-CM | POA: Diagnosis not present

## 2018-03-16 DIAGNOSIS — J3089 Other allergic rhinitis: Secondary | ICD-10-CM | POA: Diagnosis not present

## 2018-03-30 DIAGNOSIS — J3081 Allergic rhinitis due to animal (cat) (dog) hair and dander: Secondary | ICD-10-CM | POA: Diagnosis not present

## 2018-03-30 DIAGNOSIS — J301 Allergic rhinitis due to pollen: Secondary | ICD-10-CM | POA: Diagnosis not present

## 2018-03-30 DIAGNOSIS — J3089 Other allergic rhinitis: Secondary | ICD-10-CM | POA: Diagnosis not present

## 2018-04-13 DIAGNOSIS — J3081 Allergic rhinitis due to animal (cat) (dog) hair and dander: Secondary | ICD-10-CM | POA: Diagnosis not present

## 2018-04-13 DIAGNOSIS — J301 Allergic rhinitis due to pollen: Secondary | ICD-10-CM | POA: Diagnosis not present

## 2018-04-13 DIAGNOSIS — J3089 Other allergic rhinitis: Secondary | ICD-10-CM | POA: Diagnosis not present

## 2018-04-18 DIAGNOSIS — J3089 Other allergic rhinitis: Secondary | ICD-10-CM | POA: Diagnosis not present

## 2018-04-18 DIAGNOSIS — J301 Allergic rhinitis due to pollen: Secondary | ICD-10-CM | POA: Diagnosis not present

## 2018-04-18 DIAGNOSIS — J3081 Allergic rhinitis due to animal (cat) (dog) hair and dander: Secondary | ICD-10-CM | POA: Diagnosis not present

## 2018-04-19 DIAGNOSIS — J3089 Other allergic rhinitis: Secondary | ICD-10-CM | POA: Diagnosis not present

## 2018-04-25 DIAGNOSIS — J3089 Other allergic rhinitis: Secondary | ICD-10-CM | POA: Diagnosis not present

## 2018-04-25 DIAGNOSIS — J301 Allergic rhinitis due to pollen: Secondary | ICD-10-CM | POA: Diagnosis not present

## 2018-04-25 DIAGNOSIS — J3081 Allergic rhinitis due to animal (cat) (dog) hair and dander: Secondary | ICD-10-CM | POA: Diagnosis not present

## 2018-05-06 DIAGNOSIS — J301 Allergic rhinitis due to pollen: Secondary | ICD-10-CM | POA: Diagnosis not present

## 2018-05-06 DIAGNOSIS — J3081 Allergic rhinitis due to animal (cat) (dog) hair and dander: Secondary | ICD-10-CM | POA: Diagnosis not present

## 2018-05-06 DIAGNOSIS — J3089 Other allergic rhinitis: Secondary | ICD-10-CM | POA: Diagnosis not present

## 2018-05-12 DIAGNOSIS — J3081 Allergic rhinitis due to animal (cat) (dog) hair and dander: Secondary | ICD-10-CM | POA: Diagnosis not present

## 2018-05-12 DIAGNOSIS — J3089 Other allergic rhinitis: Secondary | ICD-10-CM | POA: Diagnosis not present

## 2018-05-12 DIAGNOSIS — J301 Allergic rhinitis due to pollen: Secondary | ICD-10-CM | POA: Diagnosis not present

## 2018-05-18 DIAGNOSIS — J3089 Other allergic rhinitis: Secondary | ICD-10-CM | POA: Diagnosis not present

## 2018-05-18 DIAGNOSIS — J3081 Allergic rhinitis due to animal (cat) (dog) hair and dander: Secondary | ICD-10-CM | POA: Diagnosis not present

## 2018-05-18 DIAGNOSIS — J301 Allergic rhinitis due to pollen: Secondary | ICD-10-CM | POA: Diagnosis not present

## 2018-05-25 DIAGNOSIS — J301 Allergic rhinitis due to pollen: Secondary | ICD-10-CM | POA: Diagnosis not present

## 2018-05-25 DIAGNOSIS — J3081 Allergic rhinitis due to animal (cat) (dog) hair and dander: Secondary | ICD-10-CM | POA: Diagnosis not present

## 2018-05-25 DIAGNOSIS — J3089 Other allergic rhinitis: Secondary | ICD-10-CM | POA: Diagnosis not present

## 2018-06-01 DIAGNOSIS — J3089 Other allergic rhinitis: Secondary | ICD-10-CM | POA: Diagnosis not present

## 2018-06-01 DIAGNOSIS — J301 Allergic rhinitis due to pollen: Secondary | ICD-10-CM | POA: Diagnosis not present

## 2018-06-01 DIAGNOSIS — J3081 Allergic rhinitis due to animal (cat) (dog) hair and dander: Secondary | ICD-10-CM | POA: Diagnosis not present

## 2018-06-08 DIAGNOSIS — J3089 Other allergic rhinitis: Secondary | ICD-10-CM | POA: Diagnosis not present

## 2018-06-08 DIAGNOSIS — J301 Allergic rhinitis due to pollen: Secondary | ICD-10-CM | POA: Diagnosis not present

## 2018-06-08 DIAGNOSIS — J3081 Allergic rhinitis due to animal (cat) (dog) hair and dander: Secondary | ICD-10-CM | POA: Diagnosis not present

## 2018-06-23 DIAGNOSIS — J301 Allergic rhinitis due to pollen: Secondary | ICD-10-CM | POA: Diagnosis not present

## 2018-06-23 DIAGNOSIS — J3089 Other allergic rhinitis: Secondary | ICD-10-CM | POA: Diagnosis not present

## 2018-06-23 DIAGNOSIS — J3081 Allergic rhinitis due to animal (cat) (dog) hair and dander: Secondary | ICD-10-CM | POA: Diagnosis not present

## 2018-06-30 DIAGNOSIS — J3089 Other allergic rhinitis: Secondary | ICD-10-CM | POA: Diagnosis not present

## 2018-06-30 DIAGNOSIS — J3081 Allergic rhinitis due to animal (cat) (dog) hair and dander: Secondary | ICD-10-CM | POA: Diagnosis not present

## 2018-06-30 DIAGNOSIS — J301 Allergic rhinitis due to pollen: Secondary | ICD-10-CM | POA: Diagnosis not present

## 2018-07-12 DIAGNOSIS — Z1151 Encounter for screening for human papillomavirus (HPV): Secondary | ICD-10-CM | POA: Diagnosis not present

## 2018-07-12 DIAGNOSIS — Z6824 Body mass index (BMI) 24.0-24.9, adult: Secondary | ICD-10-CM | POA: Diagnosis not present

## 2018-07-12 DIAGNOSIS — Z01419 Encounter for gynecological examination (general) (routine) without abnormal findings: Secondary | ICD-10-CM | POA: Diagnosis not present

## 2018-07-12 DIAGNOSIS — Z1231 Encounter for screening mammogram for malignant neoplasm of breast: Secondary | ICD-10-CM | POA: Diagnosis not present

## 2018-07-13 DIAGNOSIS — J3081 Allergic rhinitis due to animal (cat) (dog) hair and dander: Secondary | ICD-10-CM | POA: Diagnosis not present

## 2018-07-13 DIAGNOSIS — J301 Allergic rhinitis due to pollen: Secondary | ICD-10-CM | POA: Diagnosis not present

## 2018-07-13 DIAGNOSIS — J3089 Other allergic rhinitis: Secondary | ICD-10-CM | POA: Diagnosis not present

## 2018-07-20 DIAGNOSIS — J301 Allergic rhinitis due to pollen: Secondary | ICD-10-CM | POA: Diagnosis not present

## 2018-07-20 DIAGNOSIS — J3081 Allergic rhinitis due to animal (cat) (dog) hair and dander: Secondary | ICD-10-CM | POA: Diagnosis not present

## 2018-07-20 DIAGNOSIS — J3089 Other allergic rhinitis: Secondary | ICD-10-CM | POA: Diagnosis not present

## 2018-07-29 DIAGNOSIS — H33303 Unspecified retinal break, bilateral: Secondary | ICD-10-CM | POA: Diagnosis not present

## 2018-07-29 DIAGNOSIS — H5213 Myopia, bilateral: Secondary | ICD-10-CM | POA: Diagnosis not present

## 2018-07-29 DIAGNOSIS — H43812 Vitreous degeneration, left eye: Secondary | ICD-10-CM | POA: Diagnosis not present

## 2018-07-29 DIAGNOSIS — H04123 Dry eye syndrome of bilateral lacrimal glands: Secondary | ICD-10-CM | POA: Diagnosis not present

## 2018-08-03 DIAGNOSIS — J3081 Allergic rhinitis due to animal (cat) (dog) hair and dander: Secondary | ICD-10-CM | POA: Diagnosis not present

## 2018-08-03 DIAGNOSIS — J301 Allergic rhinitis due to pollen: Secondary | ICD-10-CM | POA: Diagnosis not present

## 2018-08-03 DIAGNOSIS — J3089 Other allergic rhinitis: Secondary | ICD-10-CM | POA: Diagnosis not present

## 2018-08-11 DIAGNOSIS — J301 Allergic rhinitis due to pollen: Secondary | ICD-10-CM | POA: Diagnosis not present

## 2018-08-11 DIAGNOSIS — J3089 Other allergic rhinitis: Secondary | ICD-10-CM | POA: Diagnosis not present

## 2018-08-11 DIAGNOSIS — J3081 Allergic rhinitis due to animal (cat) (dog) hair and dander: Secondary | ICD-10-CM | POA: Diagnosis not present

## 2018-08-16 DIAGNOSIS — J3081 Allergic rhinitis due to animal (cat) (dog) hair and dander: Secondary | ICD-10-CM | POA: Diagnosis not present

## 2018-08-16 DIAGNOSIS — J3089 Other allergic rhinitis: Secondary | ICD-10-CM | POA: Diagnosis not present

## 2018-08-16 DIAGNOSIS — J301 Allergic rhinitis due to pollen: Secondary | ICD-10-CM | POA: Diagnosis not present

## 2018-09-07 DIAGNOSIS — J3081 Allergic rhinitis due to animal (cat) (dog) hair and dander: Secondary | ICD-10-CM | POA: Diagnosis not present

## 2018-09-07 DIAGNOSIS — J301 Allergic rhinitis due to pollen: Secondary | ICD-10-CM | POA: Diagnosis not present

## 2018-09-07 DIAGNOSIS — J4530 Mild persistent asthma, uncomplicated: Secondary | ICD-10-CM | POA: Diagnosis not present

## 2018-09-07 DIAGNOSIS — J0100 Acute maxillary sinusitis, unspecified: Secondary | ICD-10-CM | POA: Diagnosis not present

## 2018-09-23 DIAGNOSIS — J3081 Allergic rhinitis due to animal (cat) (dog) hair and dander: Secondary | ICD-10-CM | POA: Diagnosis not present

## 2018-09-23 DIAGNOSIS — J3089 Other allergic rhinitis: Secondary | ICD-10-CM | POA: Diagnosis not present

## 2018-09-23 DIAGNOSIS — J301 Allergic rhinitis due to pollen: Secondary | ICD-10-CM | POA: Diagnosis not present

## 2018-10-04 DIAGNOSIS — Z23 Encounter for immunization: Secondary | ICD-10-CM | POA: Diagnosis not present

## 2018-10-06 DIAGNOSIS — J3081 Allergic rhinitis due to animal (cat) (dog) hair and dander: Secondary | ICD-10-CM | POA: Diagnosis not present

## 2018-10-06 DIAGNOSIS — J301 Allergic rhinitis due to pollen: Secondary | ICD-10-CM | POA: Diagnosis not present

## 2018-10-06 DIAGNOSIS — J3089 Other allergic rhinitis: Secondary | ICD-10-CM | POA: Diagnosis not present

## 2018-10-19 DIAGNOSIS — L821 Other seborrheic keratosis: Secondary | ICD-10-CM | POA: Diagnosis not present

## 2018-10-19 DIAGNOSIS — D225 Melanocytic nevi of trunk: Secondary | ICD-10-CM | POA: Diagnosis not present

## 2018-10-19 DIAGNOSIS — Z86018 Personal history of other benign neoplasm: Secondary | ICD-10-CM | POA: Diagnosis not present

## 2018-10-19 DIAGNOSIS — L814 Other melanin hyperpigmentation: Secondary | ICD-10-CM | POA: Diagnosis not present

## 2018-10-28 DIAGNOSIS — J3089 Other allergic rhinitis: Secondary | ICD-10-CM | POA: Diagnosis not present

## 2018-10-28 DIAGNOSIS — J3081 Allergic rhinitis due to animal (cat) (dog) hair and dander: Secondary | ICD-10-CM | POA: Diagnosis not present

## 2018-10-28 DIAGNOSIS — J301 Allergic rhinitis due to pollen: Secondary | ICD-10-CM | POA: Diagnosis not present

## 2018-11-09 DIAGNOSIS — J3081 Allergic rhinitis due to animal (cat) (dog) hair and dander: Secondary | ICD-10-CM | POA: Diagnosis not present

## 2018-11-09 DIAGNOSIS — J3089 Other allergic rhinitis: Secondary | ICD-10-CM | POA: Diagnosis not present

## 2018-11-09 DIAGNOSIS — J301 Allergic rhinitis due to pollen: Secondary | ICD-10-CM | POA: Diagnosis not present

## 2018-11-18 DIAGNOSIS — J3081 Allergic rhinitis due to animal (cat) (dog) hair and dander: Secondary | ICD-10-CM | POA: Diagnosis not present

## 2018-11-18 DIAGNOSIS — J3089 Other allergic rhinitis: Secondary | ICD-10-CM | POA: Diagnosis not present

## 2018-11-18 DIAGNOSIS — J301 Allergic rhinitis due to pollen: Secondary | ICD-10-CM | POA: Diagnosis not present

## 2018-12-19 DIAGNOSIS — J301 Allergic rhinitis due to pollen: Secondary | ICD-10-CM | POA: Diagnosis not present

## 2018-12-19 DIAGNOSIS — J3081 Allergic rhinitis due to animal (cat) (dog) hair and dander: Secondary | ICD-10-CM | POA: Diagnosis not present

## 2018-12-19 DIAGNOSIS — J3089 Other allergic rhinitis: Secondary | ICD-10-CM | POA: Diagnosis not present

## 2018-12-26 DIAGNOSIS — J301 Allergic rhinitis due to pollen: Secondary | ICD-10-CM | POA: Diagnosis not present

## 2018-12-26 DIAGNOSIS — J3081 Allergic rhinitis due to animal (cat) (dog) hair and dander: Secondary | ICD-10-CM | POA: Diagnosis not present

## 2018-12-27 DIAGNOSIS — J3089 Other allergic rhinitis: Secondary | ICD-10-CM | POA: Diagnosis not present

## 2019-01-06 DIAGNOSIS — J3081 Allergic rhinitis due to animal (cat) (dog) hair and dander: Secondary | ICD-10-CM | POA: Diagnosis not present

## 2019-01-06 DIAGNOSIS — J3089 Other allergic rhinitis: Secondary | ICD-10-CM | POA: Diagnosis not present

## 2019-01-06 DIAGNOSIS — J301 Allergic rhinitis due to pollen: Secondary | ICD-10-CM | POA: Diagnosis not present

## 2019-01-25 DIAGNOSIS — J301 Allergic rhinitis due to pollen: Secondary | ICD-10-CM | POA: Diagnosis not present

## 2019-01-25 DIAGNOSIS — J3089 Other allergic rhinitis: Secondary | ICD-10-CM | POA: Diagnosis not present

## 2019-01-25 DIAGNOSIS — J3081 Allergic rhinitis due to animal (cat) (dog) hair and dander: Secondary | ICD-10-CM | POA: Diagnosis not present

## 2019-02-02 DIAGNOSIS — J4530 Mild persistent asthma, uncomplicated: Secondary | ICD-10-CM | POA: Diagnosis not present

## 2019-02-02 DIAGNOSIS — J3081 Allergic rhinitis due to animal (cat) (dog) hair and dander: Secondary | ICD-10-CM | POA: Diagnosis not present

## 2019-02-02 DIAGNOSIS — J301 Allergic rhinitis due to pollen: Secondary | ICD-10-CM | POA: Diagnosis not present

## 2019-02-02 DIAGNOSIS — J3089 Other allergic rhinitis: Secondary | ICD-10-CM | POA: Diagnosis not present

## 2019-02-24 DIAGNOSIS — J3081 Allergic rhinitis due to animal (cat) (dog) hair and dander: Secondary | ICD-10-CM | POA: Diagnosis not present

## 2019-02-24 DIAGNOSIS — J3089 Other allergic rhinitis: Secondary | ICD-10-CM | POA: Diagnosis not present

## 2019-02-24 DIAGNOSIS — J301 Allergic rhinitis due to pollen: Secondary | ICD-10-CM | POA: Diagnosis not present

## 2019-04-05 DIAGNOSIS — J3081 Allergic rhinitis due to animal (cat) (dog) hair and dander: Secondary | ICD-10-CM | POA: Diagnosis not present

## 2019-04-05 DIAGNOSIS — J3089 Other allergic rhinitis: Secondary | ICD-10-CM | POA: Diagnosis not present

## 2019-04-05 DIAGNOSIS — J301 Allergic rhinitis due to pollen: Secondary | ICD-10-CM | POA: Diagnosis not present

## 2019-04-12 DIAGNOSIS — J301 Allergic rhinitis due to pollen: Secondary | ICD-10-CM | POA: Diagnosis not present

## 2019-04-12 DIAGNOSIS — J3081 Allergic rhinitis due to animal (cat) (dog) hair and dander: Secondary | ICD-10-CM | POA: Diagnosis not present

## 2019-04-12 DIAGNOSIS — J3089 Other allergic rhinitis: Secondary | ICD-10-CM | POA: Diagnosis not present

## 2019-04-19 DIAGNOSIS — J301 Allergic rhinitis due to pollen: Secondary | ICD-10-CM | POA: Diagnosis not present

## 2019-04-19 DIAGNOSIS — J3081 Allergic rhinitis due to animal (cat) (dog) hair and dander: Secondary | ICD-10-CM | POA: Diagnosis not present

## 2019-04-19 DIAGNOSIS — J3089 Other allergic rhinitis: Secondary | ICD-10-CM | POA: Diagnosis not present

## 2019-04-26 DIAGNOSIS — J301 Allergic rhinitis due to pollen: Secondary | ICD-10-CM | POA: Diagnosis not present

## 2019-04-26 DIAGNOSIS — J3081 Allergic rhinitis due to animal (cat) (dog) hair and dander: Secondary | ICD-10-CM | POA: Diagnosis not present

## 2019-04-26 DIAGNOSIS — J3089 Other allergic rhinitis: Secondary | ICD-10-CM | POA: Diagnosis not present

## 2019-05-01 DIAGNOSIS — Z03818 Encounter for observation for suspected exposure to other biological agents ruled out: Secondary | ICD-10-CM | POA: Diagnosis not present

## 2019-05-03 DIAGNOSIS — J3081 Allergic rhinitis due to animal (cat) (dog) hair and dander: Secondary | ICD-10-CM | POA: Diagnosis not present

## 2019-05-03 DIAGNOSIS — J3089 Other allergic rhinitis: Secondary | ICD-10-CM | POA: Diagnosis not present

## 2019-05-03 DIAGNOSIS — J301 Allergic rhinitis due to pollen: Secondary | ICD-10-CM | POA: Diagnosis not present

## 2019-05-10 DIAGNOSIS — J3081 Allergic rhinitis due to animal (cat) (dog) hair and dander: Secondary | ICD-10-CM | POA: Diagnosis not present

## 2019-05-10 DIAGNOSIS — J3089 Other allergic rhinitis: Secondary | ICD-10-CM | POA: Diagnosis not present

## 2019-05-10 DIAGNOSIS — J301 Allergic rhinitis due to pollen: Secondary | ICD-10-CM | POA: Diagnosis not present

## 2019-05-17 DIAGNOSIS — J3089 Other allergic rhinitis: Secondary | ICD-10-CM | POA: Diagnosis not present

## 2019-05-17 DIAGNOSIS — J3081 Allergic rhinitis due to animal (cat) (dog) hair and dander: Secondary | ICD-10-CM | POA: Diagnosis not present

## 2019-05-17 DIAGNOSIS — J301 Allergic rhinitis due to pollen: Secondary | ICD-10-CM | POA: Diagnosis not present

## 2019-05-31 DIAGNOSIS — J301 Allergic rhinitis due to pollen: Secondary | ICD-10-CM | POA: Diagnosis not present

## 2019-05-31 DIAGNOSIS — J3081 Allergic rhinitis due to animal (cat) (dog) hair and dander: Secondary | ICD-10-CM | POA: Diagnosis not present

## 2019-05-31 DIAGNOSIS — J3089 Other allergic rhinitis: Secondary | ICD-10-CM | POA: Diagnosis not present

## 2019-06-14 DIAGNOSIS — J4530 Mild persistent asthma, uncomplicated: Secondary | ICD-10-CM | POA: Diagnosis not present

## 2019-06-14 DIAGNOSIS — J301 Allergic rhinitis due to pollen: Secondary | ICD-10-CM | POA: Diagnosis not present

## 2019-06-14 DIAGNOSIS — J3081 Allergic rhinitis due to animal (cat) (dog) hair and dander: Secondary | ICD-10-CM | POA: Diagnosis not present

## 2019-06-14 DIAGNOSIS — L501 Idiopathic urticaria: Secondary | ICD-10-CM | POA: Diagnosis not present

## 2019-06-28 DIAGNOSIS — J3081 Allergic rhinitis due to animal (cat) (dog) hair and dander: Secondary | ICD-10-CM | POA: Diagnosis not present

## 2019-06-28 DIAGNOSIS — J3089 Other allergic rhinitis: Secondary | ICD-10-CM | POA: Diagnosis not present

## 2019-06-28 DIAGNOSIS — J301 Allergic rhinitis due to pollen: Secondary | ICD-10-CM | POA: Diagnosis not present

## 2019-07-12 DIAGNOSIS — J3081 Allergic rhinitis due to animal (cat) (dog) hair and dander: Secondary | ICD-10-CM | POA: Diagnosis not present

## 2019-07-12 DIAGNOSIS — J301 Allergic rhinitis due to pollen: Secondary | ICD-10-CM | POA: Diagnosis not present

## 2019-07-12 DIAGNOSIS — J3089 Other allergic rhinitis: Secondary | ICD-10-CM | POA: Diagnosis not present

## 2019-07-19 DIAGNOSIS — J301 Allergic rhinitis due to pollen: Secondary | ICD-10-CM | POA: Diagnosis not present

## 2019-07-19 DIAGNOSIS — J3089 Other allergic rhinitis: Secondary | ICD-10-CM | POA: Diagnosis not present

## 2019-07-19 DIAGNOSIS — J3081 Allergic rhinitis due to animal (cat) (dog) hair and dander: Secondary | ICD-10-CM | POA: Diagnosis not present

## 2019-08-01 DIAGNOSIS — H33303 Unspecified retinal break, bilateral: Secondary | ICD-10-CM | POA: Diagnosis not present

## 2019-08-01 DIAGNOSIS — H5213 Myopia, bilateral: Secondary | ICD-10-CM | POA: Diagnosis not present

## 2019-08-01 DIAGNOSIS — H43813 Vitreous degeneration, bilateral: Secondary | ICD-10-CM | POA: Diagnosis not present

## 2019-08-01 DIAGNOSIS — H04123 Dry eye syndrome of bilateral lacrimal glands: Secondary | ICD-10-CM | POA: Diagnosis not present

## 2019-08-02 DIAGNOSIS — J301 Allergic rhinitis due to pollen: Secondary | ICD-10-CM | POA: Diagnosis not present

## 2019-08-02 DIAGNOSIS — J3089 Other allergic rhinitis: Secondary | ICD-10-CM | POA: Diagnosis not present

## 2019-08-02 DIAGNOSIS — J3081 Allergic rhinitis due to animal (cat) (dog) hair and dander: Secondary | ICD-10-CM | POA: Diagnosis not present

## 2019-08-16 DIAGNOSIS — J3089 Other allergic rhinitis: Secondary | ICD-10-CM | POA: Diagnosis not present

## 2019-08-16 DIAGNOSIS — J301 Allergic rhinitis due to pollen: Secondary | ICD-10-CM | POA: Diagnosis not present

## 2019-08-16 DIAGNOSIS — J3081 Allergic rhinitis due to animal (cat) (dog) hair and dander: Secondary | ICD-10-CM | POA: Diagnosis not present

## 2019-08-30 DIAGNOSIS — J3081 Allergic rhinitis due to animal (cat) (dog) hair and dander: Secondary | ICD-10-CM | POA: Diagnosis not present

## 2019-08-30 DIAGNOSIS — J3089 Other allergic rhinitis: Secondary | ICD-10-CM | POA: Diagnosis not present

## 2019-08-30 DIAGNOSIS — J301 Allergic rhinitis due to pollen: Secondary | ICD-10-CM | POA: Diagnosis not present

## 2019-09-13 DIAGNOSIS — J301 Allergic rhinitis due to pollen: Secondary | ICD-10-CM | POA: Diagnosis not present

## 2019-09-13 DIAGNOSIS — J3089 Other allergic rhinitis: Secondary | ICD-10-CM | POA: Diagnosis not present

## 2019-09-13 DIAGNOSIS — J3081 Allergic rhinitis due to animal (cat) (dog) hair and dander: Secondary | ICD-10-CM | POA: Diagnosis not present

## 2019-09-26 DIAGNOSIS — Z23 Encounter for immunization: Secondary | ICD-10-CM | POA: Diagnosis not present

## 2019-10-04 DIAGNOSIS — J3089 Other allergic rhinitis: Secondary | ICD-10-CM | POA: Diagnosis not present

## 2019-10-04 DIAGNOSIS — J301 Allergic rhinitis due to pollen: Secondary | ICD-10-CM | POA: Diagnosis not present

## 2019-10-04 DIAGNOSIS — J3081 Allergic rhinitis due to animal (cat) (dog) hair and dander: Secondary | ICD-10-CM | POA: Diagnosis not present

## 2019-10-19 DIAGNOSIS — J3089 Other allergic rhinitis: Secondary | ICD-10-CM | POA: Diagnosis not present

## 2019-10-19 DIAGNOSIS — J301 Allergic rhinitis due to pollen: Secondary | ICD-10-CM | POA: Diagnosis not present

## 2019-10-19 DIAGNOSIS — J3081 Allergic rhinitis due to animal (cat) (dog) hair and dander: Secondary | ICD-10-CM | POA: Diagnosis not present

## 2019-11-01 DIAGNOSIS — D225 Melanocytic nevi of trunk: Secondary | ICD-10-CM | POA: Diagnosis not present

## 2019-11-01 DIAGNOSIS — Z23 Encounter for immunization: Secondary | ICD-10-CM | POA: Diagnosis not present

## 2019-11-01 DIAGNOSIS — D224 Melanocytic nevi of scalp and neck: Secondary | ICD-10-CM | POA: Diagnosis not present

## 2019-11-01 DIAGNOSIS — Z86018 Personal history of other benign neoplasm: Secondary | ICD-10-CM | POA: Diagnosis not present

## 2019-11-01 DIAGNOSIS — L821 Other seborrheic keratosis: Secondary | ICD-10-CM | POA: Diagnosis not present

## 2019-11-01 DIAGNOSIS — L814 Other melanin hyperpigmentation: Secondary | ICD-10-CM | POA: Diagnosis not present

## 2019-11-06 DIAGNOSIS — J3081 Allergic rhinitis due to animal (cat) (dog) hair and dander: Secondary | ICD-10-CM | POA: Diagnosis not present

## 2019-11-06 DIAGNOSIS — J301 Allergic rhinitis due to pollen: Secondary | ICD-10-CM | POA: Diagnosis not present

## 2019-11-06 DIAGNOSIS — J3089 Other allergic rhinitis: Secondary | ICD-10-CM | POA: Diagnosis not present

## 2019-11-22 DIAGNOSIS — J3081 Allergic rhinitis due to animal (cat) (dog) hair and dander: Secondary | ICD-10-CM | POA: Diagnosis not present

## 2019-11-22 DIAGNOSIS — J301 Allergic rhinitis due to pollen: Secondary | ICD-10-CM | POA: Diagnosis not present

## 2019-11-22 DIAGNOSIS — J3089 Other allergic rhinitis: Secondary | ICD-10-CM | POA: Diagnosis not present

## 2019-12-06 DIAGNOSIS — J3081 Allergic rhinitis due to animal (cat) (dog) hair and dander: Secondary | ICD-10-CM | POA: Diagnosis not present

## 2019-12-06 DIAGNOSIS — J301 Allergic rhinitis due to pollen: Secondary | ICD-10-CM | POA: Diagnosis not present

## 2019-12-06 DIAGNOSIS — J3089 Other allergic rhinitis: Secondary | ICD-10-CM | POA: Diagnosis not present

## 2019-12-21 DIAGNOSIS — J3089 Other allergic rhinitis: Secondary | ICD-10-CM | POA: Diagnosis not present

## 2019-12-21 DIAGNOSIS — J3081 Allergic rhinitis due to animal (cat) (dog) hair and dander: Secondary | ICD-10-CM | POA: Diagnosis not present

## 2019-12-21 DIAGNOSIS — J301 Allergic rhinitis due to pollen: Secondary | ICD-10-CM | POA: Diagnosis not present

## 2020-01-10 DIAGNOSIS — J3081 Allergic rhinitis due to animal (cat) (dog) hair and dander: Secondary | ICD-10-CM | POA: Diagnosis not present

## 2020-01-10 DIAGNOSIS — J301 Allergic rhinitis due to pollen: Secondary | ICD-10-CM | POA: Diagnosis not present

## 2020-01-10 DIAGNOSIS — J3089 Other allergic rhinitis: Secondary | ICD-10-CM | POA: Diagnosis not present

## 2020-01-11 DIAGNOSIS — J3089 Other allergic rhinitis: Secondary | ICD-10-CM | POA: Diagnosis not present

## 2020-01-24 DIAGNOSIS — J301 Allergic rhinitis due to pollen: Secondary | ICD-10-CM | POA: Diagnosis not present

## 2020-01-24 DIAGNOSIS — J3081 Allergic rhinitis due to animal (cat) (dog) hair and dander: Secondary | ICD-10-CM | POA: Diagnosis not present

## 2020-01-24 DIAGNOSIS — J3089 Other allergic rhinitis: Secondary | ICD-10-CM | POA: Diagnosis not present

## 2020-01-31 DIAGNOSIS — J3089 Other allergic rhinitis: Secondary | ICD-10-CM | POA: Diagnosis not present

## 2020-01-31 DIAGNOSIS — J4530 Mild persistent asthma, uncomplicated: Secondary | ICD-10-CM | POA: Diagnosis not present

## 2020-01-31 DIAGNOSIS — J301 Allergic rhinitis due to pollen: Secondary | ICD-10-CM | POA: Diagnosis not present

## 2020-01-31 DIAGNOSIS — J3081 Allergic rhinitis due to animal (cat) (dog) hair and dander: Secondary | ICD-10-CM | POA: Diagnosis not present

## 2020-02-07 DIAGNOSIS — J3089 Other allergic rhinitis: Secondary | ICD-10-CM | POA: Diagnosis not present

## 2020-02-07 DIAGNOSIS — J301 Allergic rhinitis due to pollen: Secondary | ICD-10-CM | POA: Diagnosis not present

## 2020-02-07 DIAGNOSIS — J3081 Allergic rhinitis due to animal (cat) (dog) hair and dander: Secondary | ICD-10-CM | POA: Diagnosis not present

## 2020-02-14 DIAGNOSIS — J3081 Allergic rhinitis due to animal (cat) (dog) hair and dander: Secondary | ICD-10-CM | POA: Diagnosis not present

## 2020-02-14 DIAGNOSIS — J301 Allergic rhinitis due to pollen: Secondary | ICD-10-CM | POA: Diagnosis not present

## 2020-02-14 DIAGNOSIS — J3089 Other allergic rhinitis: Secondary | ICD-10-CM | POA: Diagnosis not present

## 2020-02-21 DIAGNOSIS — J3089 Other allergic rhinitis: Secondary | ICD-10-CM | POA: Diagnosis not present

## 2020-02-21 DIAGNOSIS — J3081 Allergic rhinitis due to animal (cat) (dog) hair and dander: Secondary | ICD-10-CM | POA: Diagnosis not present

## 2020-02-21 DIAGNOSIS — J301 Allergic rhinitis due to pollen: Secondary | ICD-10-CM | POA: Diagnosis not present

## 2020-03-06 DIAGNOSIS — J3089 Other allergic rhinitis: Secondary | ICD-10-CM | POA: Diagnosis not present

## 2020-03-06 DIAGNOSIS — J3081 Allergic rhinitis due to animal (cat) (dog) hair and dander: Secondary | ICD-10-CM | POA: Diagnosis not present

## 2020-03-06 DIAGNOSIS — J301 Allergic rhinitis due to pollen: Secondary | ICD-10-CM | POA: Diagnosis not present

## 2020-07-26 ENCOUNTER — Other Ambulatory Visit: Payer: Self-pay | Admitting: Obstetrics and Gynecology

## 2020-07-26 DIAGNOSIS — Z1231 Encounter for screening mammogram for malignant neoplasm of breast: Secondary | ICD-10-CM

## 2020-08-06 DIAGNOSIS — H5213 Myopia, bilateral: Secondary | ICD-10-CM | POA: Diagnosis not present

## 2020-08-06 DIAGNOSIS — H33301 Unspecified retinal break, right eye: Secondary | ICD-10-CM | POA: Diagnosis not present

## 2020-08-06 DIAGNOSIS — H43813 Vitreous degeneration, bilateral: Secondary | ICD-10-CM | POA: Diagnosis not present

## 2020-08-08 DIAGNOSIS — H33321 Round hole, right eye: Secondary | ICD-10-CM | POA: Diagnosis not present

## 2020-08-08 DIAGNOSIS — H43813 Vitreous degeneration, bilateral: Secondary | ICD-10-CM | POA: Diagnosis not present

## 2020-08-08 DIAGNOSIS — H43393 Other vitreous opacities, bilateral: Secondary | ICD-10-CM | POA: Diagnosis not present

## 2020-08-09 ENCOUNTER — Other Ambulatory Visit: Payer: Self-pay

## 2020-08-09 ENCOUNTER — Ambulatory Visit
Admission: RE | Admit: 2020-08-09 | Discharge: 2020-08-09 | Disposition: A | Discharge status: Home / Self Care | Payer: BC Managed Care – PPO | Source: Ambulatory Visit | Attending: Obstetrics and Gynecology | Admitting: Obstetrics and Gynecology

## 2020-08-09 DIAGNOSIS — Z1231 Encounter for screening mammogram for malignant neoplasm of breast: Secondary | ICD-10-CM

## 2020-09-04 DIAGNOSIS — H43393 Other vitreous opacities, bilateral: Secondary | ICD-10-CM | POA: Diagnosis not present

## 2020-09-04 DIAGNOSIS — H43812 Vitreous degeneration, left eye: Secondary | ICD-10-CM | POA: Diagnosis not present

## 2020-09-14 DIAGNOSIS — Z23 Encounter for immunization: Secondary | ICD-10-CM | POA: Diagnosis not present

## 2020-09-23 DIAGNOSIS — Z13220 Encounter for screening for lipoid disorders: Secondary | ICD-10-CM | POA: Diagnosis not present

## 2020-09-23 DIAGNOSIS — Z124 Encounter for screening for malignant neoplasm of cervix: Secondary | ICD-10-CM | POA: Diagnosis not present

## 2020-09-23 DIAGNOSIS — Z131 Encounter for screening for diabetes mellitus: Secondary | ICD-10-CM | POA: Diagnosis not present

## 2020-09-23 DIAGNOSIS — Z01419 Encounter for gynecological examination (general) (routine) without abnormal findings: Secondary | ICD-10-CM | POA: Diagnosis not present

## 2020-09-23 DIAGNOSIS — Z13228 Encounter for screening for other metabolic disorders: Secondary | ICD-10-CM | POA: Diagnosis not present

## 2020-09-23 DIAGNOSIS — Z1329 Encounter for screening for other suspected endocrine disorder: Secondary | ICD-10-CM | POA: Diagnosis not present

## 2020-09-25 DIAGNOSIS — Z23 Encounter for immunization: Secondary | ICD-10-CM | POA: Diagnosis not present

## 2020-10-23 DIAGNOSIS — M4317 Spondylolisthesis, lumbosacral region: Secondary | ICD-10-CM | POA: Diagnosis not present

## 2020-10-23 DIAGNOSIS — R030 Elevated blood-pressure reading, without diagnosis of hypertension: Secondary | ICD-10-CM | POA: Diagnosis not present

## 2020-11-04 DIAGNOSIS — H5711 Ocular pain, right eye: Secondary | ICD-10-CM | POA: Diagnosis not present

## 2020-11-05 DIAGNOSIS — M4317 Spondylolisthesis, lumbosacral region: Secondary | ICD-10-CM | POA: Diagnosis not present

## 2020-11-05 DIAGNOSIS — Z86018 Personal history of other benign neoplasm: Secondary | ICD-10-CM | POA: Diagnosis not present

## 2020-11-05 DIAGNOSIS — L814 Other melanin hyperpigmentation: Secondary | ICD-10-CM | POA: Diagnosis not present

## 2020-11-05 DIAGNOSIS — D225 Melanocytic nevi of trunk: Secondary | ICD-10-CM | POA: Diagnosis not present

## 2020-11-05 DIAGNOSIS — M545 Low back pain, unspecified: Secondary | ICD-10-CM | POA: Diagnosis not present

## 2020-11-05 DIAGNOSIS — L821 Other seborrheic keratosis: Secondary | ICD-10-CM | POA: Diagnosis not present

## 2020-11-06 ENCOUNTER — Other Ambulatory Visit: Payer: Self-pay

## 2020-11-06 ENCOUNTER — Emergency Department (HOSPITAL_COMMUNITY)
Admission: EM | Admit: 2020-11-06 | Discharge: 2020-11-07 | Disposition: A | Discharge status: Home / Self Care | Payer: BC Managed Care – PPO | Attending: Emergency Medicine | Admitting: Emergency Medicine

## 2020-11-06 ENCOUNTER — Emergency Department (HOSPITAL_COMMUNITY): Payer: BC Managed Care – PPO

## 2020-11-06 ENCOUNTER — Encounter (HOSPITAL_COMMUNITY): Payer: Self-pay | Admitting: Pediatrics

## 2020-11-06 ENCOUNTER — Other Ambulatory Visit: Payer: Self-pay | Admitting: Ophthalmology

## 2020-11-06 DIAGNOSIS — J320 Chronic maxillary sinusitis: Secondary | ICD-10-CM | POA: Diagnosis not present

## 2020-11-06 DIAGNOSIS — J0120 Acute ethmoidal sinusitis, unspecified: Secondary | ICD-10-CM | POA: Diagnosis not present

## 2020-11-06 DIAGNOSIS — R519 Headache, unspecified: Secondary | ICD-10-CM

## 2020-11-06 DIAGNOSIS — H5711 Ocular pain, right eye: Secondary | ICD-10-CM | POA: Diagnosis not present

## 2020-11-06 DIAGNOSIS — R29810 Facial weakness: Secondary | ICD-10-CM | POA: Insufficient documentation

## 2020-11-06 DIAGNOSIS — J3489 Other specified disorders of nose and nasal sinuses: Secondary | ICD-10-CM | POA: Diagnosis not present

## 2020-11-06 DIAGNOSIS — J322 Chronic ethmoidal sinusitis: Secondary | ICD-10-CM | POA: Diagnosis not present

## 2020-11-06 DIAGNOSIS — J45909 Unspecified asthma, uncomplicated: Secondary | ICD-10-CM | POA: Insufficient documentation

## 2020-11-06 DIAGNOSIS — M792 Neuralgia and neuritis, unspecified: Secondary | ICD-10-CM | POA: Diagnosis not present

## 2020-11-06 DIAGNOSIS — H538 Other visual disturbances: Secondary | ICD-10-CM | POA: Diagnosis not present

## 2020-11-06 DIAGNOSIS — R200 Anesthesia of skin: Secondary | ICD-10-CM | POA: Diagnosis not present

## 2020-11-06 DIAGNOSIS — H539 Unspecified visual disturbance: Secondary | ICD-10-CM | POA: Diagnosis not present

## 2020-11-06 LAB — C-REACTIVE PROTEIN: CRP: <0.5 mg/dL (ref <1.0)

## 2020-11-06 LAB — CBC WITH DIFFERENTIAL/PLATELET
Abs Immature Granulocytes: 0.02 10*3/uL (ref 0.00–0.07)
Basophils Absolute: 0 10*3/uL (ref 0.0–0.1)
Basophils Relative: 1 %
Eosinophils Absolute: 0.1 10*3/uL (ref 0.0–0.5)
Eosinophils Relative: 2 %
HCT: 36.2 % (ref 36.0–46.0)
Hemoglobin: 11.9 g/dL — ABNORMAL LOW (ref 12.0–15.0)
Immature Granulocytes: 0 %
Lymphocytes Relative: 32 %
Lymphs Abs: 1.9 10*3/uL (ref 0.7–4.0)
MCH: 31.2 pg (ref 26.0–34.0)
MCHC: 32.9 g/dL (ref 30.0–36.0)
MCV: 94.8 fL (ref 80.0–100.0)
Monocytes Absolute: 0.4 10*3/uL (ref 0.1–1.0)
Monocytes Relative: 6 %
Neutro Abs: 3.5 10*3/uL (ref 1.7–7.7)
Neutrophils Relative %: 59 %
Platelets: 173 10*3/uL (ref 150–400)
RBC: 3.82 MIL/uL — ABNORMAL LOW (ref 3.87–5.11)
RDW: 12 % (ref 11.5–15.5)
WBC: 5.9 10*3/uL (ref 4.0–10.5)
nRBC: 0 % (ref 0.0–0.2)

## 2020-11-06 LAB — BASIC METABOLIC PANEL
Anion gap: 14 (ref 5–15)
BUN: 6 mg/dL (ref 6–20)
CO2: 24 mmol/L (ref 22–32)
Calcium: 9.6 mg/dL (ref 8.9–10.3)
Chloride: 103 mmol/L (ref 98–111)
Creatinine, Ser: 0.66 mg/dL (ref 0.44–1.00)
GFR, Estimated: >60 mL/min (ref >60)
Glucose, Bld: 87 mg/dL (ref 70–99)
Potassium: 3.5 mmol/L (ref 3.5–5.1)
Sodium: 141 mmol/L (ref 135–145)

## 2020-11-06 LAB — SEDIMENTATION RATE: Sed Rate: 6 mm/hr (ref 0–22)

## 2020-11-06 MED ORDER — GADOBUTROL 1 MMOL/ML IV SOLN
6.0000 mL | Freq: Once | INTRAVENOUS | Status: AC | PRN
  Administered 2020-11-06: 6 mL via INTRAVENOUS

## 2020-11-06 NOTE — Consult Note (Addendum)
Neurology Consultation Reason for Consult: Facial pain Referring Physician: Zenia Resides, a  CC: Facial numbness  History is obtained from: Patient  HPI: Aimee Cook is a 49 y.o. female with several days of facial pain as well as numbness that started today.  She started having supraorbital/retro-orbital pain last week, and saw her ophthalmologist on Monday.  Last night, however she noticed that she began having some numbness on the right side of her face which was more prominent today.  She saw her eye doctor again, however after she saw her the numbness was spreading and therefore she was referred to the emergency department for further evaluation.   ROS: A 14 point ROS was performed and is negative except as noted in the HPI.   Past Medical History:  Diagnosis Date  . Anemia    mild  . Asthma   . Dental crowns present   . Hemorrhoids   . History of E. coli septicemia 01/1996  . Joint pain    "back"  . Rosacea   . Seasonal allergies   . Wears glasses      Family history: No history of similar   Social History:  reports that she has never smoked. She has never used smokeless tobacco. She reports current alcohol use. She reports that she does not use drugs.   Exam: Current vital signs: BP (!) 93/53   Pulse 72   Temp 98.6 F (37 C) (Oral)   Resp 18   Ht 5' 4.5" (1.638 m)   Wt 63.5 kg   SpO2 98%   BMI 23.66 kg/m  Vital signs in last 24 hours: Temp:  [98.6 F (37 C)] 98.6 F (37 C) (11/24 1059) Pulse Rate:  [69-103] 72 (11/24 1900) Resp:  [13-22] 18 (11/24 1900) BP: (93-135)/(51-86) 93/53 (11/24 1900) SpO2:  [96 %-100 %] 98 % (11/24 1900) Weight:  [63.5 kg] 63.5 kg (11/24 1059)   Physical Exam  Constitutional: Appears well-developed and well-nourished.  Psych: Affect appropriate to situation Eyes: No scleral injection HENT: No OP obstrucion MSK: no joint deformities.  Cardiovascular: Normal rate and regular rhythm.  Respiratory: Effort normal, non-labored  breathing GI: Soft.  No distension. There is no tenderness.  Skin: WDI  Neuro: Mental Status: Patient is awake, alert, oriented to person, place, month, year, and situation. Patient is able to give a clear and coherent history. No signs of aphasia or neglect Cranial Nerves: II: Visual Fields are full. Pupils are equal, round, and reactive to light.  She has no afferent pupillary defect. III,IV, VI: EOMI without ptosis or diploplia.  V: Facial sensation is diminished on the right in V1 and V2 distribution to light touch, temperature, pinprick, she has intact V3 sensation. VII: Facial movement is symmetric.  VIII: hearing is intact to voice X: Uvula elevates symmetrically XI: Shoulder shrug is symmetric. XII: tongue is midline without atrophy or fasciculations.  Motor: Tone is normal. Bulk is normal. 5/5 strength was present in all four extremities.  Sensory: Sensation is symmetric to light touch and temperature in the arms and legs, intact to vibration. Deep Tendon Reflexes: 2+ and symmetric in the biceps and patellae.  Cerebellar: FNF and HKS are intact bilaterally   I have reviewed labs in epic and the results pertinent to this consultation are: ESR, CRP - normal  I have reviewed the images obtained: MRI brain - ? Punctate focus of enhancement in the [facility]  Impression: 63 female with numbness in the distribution of V1 and V2.  She has no signs of infeciton. She does not have any other clinical symptoms of cranial neuropathy, and therefore unclear what to make of the punctate area of possible enhancement in the IAC.  Given that it is primarily V1 and V2, I do think that further imaging of the orbit/cavernous sinus would be prudent, as etiologies such as tolosa-hunt would need to be considered.  Further recommendations pending this imaging.  My suspicion is that this is likely representing some type of trigeminal neuropathy/neuralgia.  Recommendations: 1) MRI IAC and orbits with and  without contrast 2) Serum ACE, SSA/SSB, ANA   Roland Rack, MD [facility] [phone]  If 7pm- 7am, please page neurology on call as listed in [place].

## 2020-11-06 NOTE — ED Triage Notes (Signed)
Patient c/o numbness on right side of the face started a week ago and is progressing. Endorsed hx of eye pain on the right side as well x 2 weeks and was sent by eye doctor to get further evaluation.

## 2020-11-06 NOTE — ED Provider Notes (Signed)
I saw and evaluated the patient, reviewed the resident's note and I agree with the findings and plan.  EKG:   49 year old female presents with several days of right eye pain with blurred vision but no vision loss.  Seen by ophthalmology today who could not find peripheral source of her discomfort.  Feels like she has pressure behind her eye.  Also today endorses right-sided facial paresthesias.  No focal neurological findings.  Plan is to get intracranial imaging and reassess   Lorre Nick, MD 11/06/20 1157

## 2020-11-06 NOTE — ED Provider Notes (Signed)
[facility] EMERGENCY DEPARTMENT Provider Note   CSN: [id number] Arrival date & time: 11/06/20  1051     History Chief Complaint  Patient presents with  . Numbness  . Eye Problem    Aimee Cook is a 49 y.o. female w/ PMH of dry eyes who was sent to the ED by her ophthalmologist for evaluation of right eye pain and right facial numbness. Patient states she started having pain behind her right eye last week. She saw her ophthalmologist Monday and visual assessment showed increased IOP in the right eye. She was scheduled to get a CT scan of her orbits in December. Last night, she experienced number above her right eye and this morning, the numbness started spreading down to her jaw and right ear. She called her ophthalmologist who advised her to come to the ED for imaging and further evaluation of her symptoms. Patient denies any visual loss but states she has had laser surgery on her retina twice. She denies any headache, dizziness, weakness, blurry vision, CP, SOB, palpitations or abd pain.     Past Medical History:  Diagnosis Date  . Anemia    mild  . Asthma   . Dental crowns present   . Hemorrhoids   . History of E. coli septicemia 01/1996  . Joint pain    "back"  . Rosacea   . Seasonal allergies   . Wears glasses     Patient Active Problem List   Diagnosis Date Noted  . Congenital spondylolisthesis of lumbar region 11/25/2015    Past Surgical History:  Procedure Laterality Date  . DILATION AND CURETTAGE OF UTERUS  2012  . RETINAL LASER PROCEDURE Bilateral   . WISDOM TOOTH EXTRACTION       OB History   No obstetric history on file.     No family history on file.  Social History   Tobacco Use  . Smoking status: Never Smoker  . Smokeless tobacco: Never Used  Substance Use Topics  . Alcohol use: Yes    Comment: socially  . Drug use: No    Home Medications Prior to Admission medications   Medication Sig Start Date End Date  Taking? Authorizing Provider  beclomethasone (QVAR) 80 MCG/ACT inhaler Inhale 1 puff into the lungs daily.    [provider]  cycloSPORINE (RESTASIS) 0.05 % ophthalmic emulsion Place 1 drop into both eyes 2 (two) times daily.    [provider]  dexamethasone (DECADRON) 0.75 MG tablet 2 tablets twice daily for 2 days, one tablet twice daily for 2 days, one tablet daily for 2 days. 11/27/15   Barnett Abu, MD  diazepam (VALIUM) 5 MG tablet Take 1 tablet (5 mg total) by mouth every 6 (six) hours as needed for anxiety. 11/27/15   Barnett Abu, MD  doxycycline (VIBRAMYCIN) 50 MG capsule Take 50 mg by mouth daily. 11/05/20   [provider]  Flaxseed, Linseed, (FLAX SEED OIL) 1000 MG CAPS Take 1,000 mg by mouth daily.    [provider]  fluticasone (FLONASE) 50 MCG/ACT nasal spray Place 1 spray into both nostrils daily as needed for allergies or rhinitis.    [provider]  Glucosamine 750 MG TABS Take 750 mg by mouth daily.    [provider]  Multiple Vitamins-Calcium (ONE-A-DAY WOMENS FORMULA PO) Take 1 tablet by mouth daily.    [provider]  oxyCODONE-acetaminophen (PERCOCET/ROXICET) 5-325 MG tablet Take 1-2 tablets by mouth every 4 (four) hours as  needed for moderate pain. 11/27/15   Barnett Abu, MD  polycarbophil (FIBERCON) 625 MG tablet Take 1,250 mg by mouth daily.    [provider]  Probiotic Product (PROBIOTIC PO) Take 1 capsule by mouth at bedtime.    [provider]  tacrolimus (PROTOPIC) 0.1 % ointment Apply 1 application topically at bedtime.    [provider]  valACYclovir (VALTREX) 1000 MG tablet Take 1,000 mg by mouth 3 (three) times daily. 11/06/20   [provider]    Allergies    Erythromycin, Shellfish allergy, Lanolin, and Aspirin  Review of Systems   Review of Systems  Constitutional: Negative for chills and fever.  HENT: Negative for facial swelling and sinus pain.    Eyes: Positive for pain. Negative for discharge, redness and visual disturbance.  Respiratory: Negative for cough and shortness of breath.   Cardiovascular: Negative for chest pain and palpitations.  Gastrointestinal: Negative for abdominal pain, nausea and vomiting.  Musculoskeletal: Negative for back pain and myalgias.  Skin: Negative for rash.  Neurological: Positive for numbness. Negative for dizziness, tremors, weakness and headaches.  Psychiatric/Behavioral: Negative for agitation and confusion.    Physical Exam Updated Vital Signs BP (!) 135/59 (BP Location: Left Arm)   Pulse (!) 103   Temp 98.6 F (37 C) (Oral)   Resp 16   Ht 5' 4.5" (1.638 m)   Wt 63.5 kg   SpO2 96%   BMI 23.66 kg/m   Physical Exam Constitutional:      Appearance: Normal appearance.  HENT:     Head: Normocephalic and atraumatic.     Mouth/Throat:     Mouth: Mucous membranes are moist.     Pharynx: Oropharynx is clear.  Eyes:     General: No scleral icterus.       Right eye: No discharge.        Left eye: No discharge.     Extraocular Movements: Extraocular movements intact.     Conjunctiva/sclera: Conjunctivae normal.     Pupils: Pupils are equal, round, and reactive to light.  Cardiovascular:     Rate and Rhythm: Normal rate and regular rhythm.     Pulses: Normal pulses.     Heart sounds: Normal heart sounds.  Pulmonary:     Effort: Pulmonary effort is normal.     Breath sounds: Normal breath sounds.  Abdominal:     General: Abdomen is flat. Bowel sounds are normal. There is no distension.     Palpations: Abdomen is soft.  Musculoskeletal:        General: Normal range of motion.     Cervical back: Normal range of motion.  Skin:    General: Skin is warm and dry.     Capillary Refill: Capillary refill takes less than 2 seconds.     Findings: No rash.  Neurological:     General: No focal deficit present.     Mental Status: She is alert and oriented to person, place, and time.      Sensory: Sensory deficit present.     Motor: No weakness.     Comments: Decreased sensation on the right face. Strength 5/5 in all extremities.  Psychiatric:        Mood and Affect: Mood normal.     ED Results / Procedures / Treatments   Labs (all labs ordered are listed, but only abnormal results are displayed) Labs Reviewed  CBC WITH DIFFERENTIAL/PLATELET - Abnormal; Notable for the following components:  Result Value   RBC 3.82 (*)    Hemoglobin 11.9 (*)    All other components within normal limits  BASIC METABOLIC PANEL  C-REACTIVE PROTEIN  SEDIMENTATION RATE    EKG None  Radiology CT Head Wo Contrast  Result Date: 11/06/2020 CLINICAL DATA:  Facial numbness, visual disturbance. Additional history provided: Patient reports numbness on right side of face beginning 1 week ago, progressing, history of eye pain on the right side for 2 weeks. EXAM: CT HEAD WITHOUT CONTRAST TECHNIQUE: Contiguous axial images were obtained from the base of the skull through the vertex without intravenous contrast. COMPARISON:  No pertinent prior exams available for comparison. FINDINGS: Brain: Cerebral volume is normal. There is no acute intracranial hemorrhage. No demarcated cortical infarct. No extra-axial fluid collection. No evidence of intracranial mass. No midline shift. Vascular: No hyperdense vessel. Skull: Normal. Negative for fracture or focal lesion. Sinuses/Orbits: Visualized orbits show no acute finding. Minimal left ethmoid sinus mucosal thickening. IMPRESSION: Unremarkable non-contrast CT appearance of the brain for age. No evidence of acute intracranial abnormality. Mild left ethmoid sinus mucosal thickening. Electronically Signed   By: Jackey Loge DO   On: 11/06/2020 13:59    Procedures Procedures (including critical care time)  Medications Ordered in ED Medications - No data to display  ED Course  I have reviewed the triage vital signs and the nursing notes.  Pertinent labs  & imaging results that were available during my care of the patient were reviewed by me and considered in my medical decision making (see chart for details).    MDM Rules/Calculators/A&P                          49 yo female sent by ophthalmologist for an evaluation of severe days of right eye pain and progressive right facial numbness. Found to have increase IOP in right eye at ophthalmologist office on Monday but no peripheral source for her symptoms. No focal neurological findings on exam. Negative inflammatory markers. Differentials include CVA, shingles, temporal arteritis, multiple sclerosis, hemiplegic migraine or brain aneurysm. CT head with no evidence of acute intracranial abnormality. MRI brain pending.   Patient handed off to incoming provider, Dr. Hyacinth Meeker.   Final Clinical Impression(s) / ED Diagnoses Final diagnoses:  None    Rx / DC Orders ED Discharge Orders    None       Steffanie Rainwater, MD 11/06/20 1607    Lorre Nick, MD 11/07/20 1652

## 2020-11-07 ENCOUNTER — Emergency Department (HOSPITAL_COMMUNITY): Payer: BC Managed Care – PPO

## 2020-11-07 DIAGNOSIS — J322 Chronic ethmoidal sinusitis: Secondary | ICD-10-CM | POA: Diagnosis not present

## 2020-11-07 DIAGNOSIS — J320 Chronic maxillary sinusitis: Secondary | ICD-10-CM | POA: Diagnosis not present

## 2020-11-07 DIAGNOSIS — J3489 Other specified disorders of nose and nasal sinuses: Secondary | ICD-10-CM | POA: Diagnosis not present

## 2020-11-07 DIAGNOSIS — R200 Anesthesia of skin: Secondary | ICD-10-CM | POA: Diagnosis not present

## 2020-11-07 DIAGNOSIS — H538 Other visual disturbances: Secondary | ICD-10-CM | POA: Diagnosis not present

## 2020-11-07 LAB — RPR: RPR Ser Ql: NONREACTIVE

## 2020-11-07 MED ORDER — PREDNISONE 20 MG PO TABS: 60.0000 mg | ORAL_TABLET | Freq: Every day | ORAL | 0 refills | Status: AC

## 2020-11-07 MED ORDER — GADOBUTROL 1 MMOL/ML IV SOLN
6.0000 mL | Freq: Once | INTRAVENOUS | Status: AC | PRN
  Administered 2020-11-07: 6 mL via INTRAVENOUS

## 2020-11-07 NOTE — ED Notes (Signed)
Pt was given d/c paperwork.Pt reports no questions at this time.

## 2020-11-07 NOTE — ED Provider Notes (Signed)
I was able to discuss the MRI findings with the patient and her spouse, I also discussed the findings with neurology, they have recommended very specific MRIs which cannot be done until the contrast is out of the patient's system hence there has been a significant wait to get this done tonight. The patient is willing to wait for these results so that the treatment plan can be appropriately initiated. At change of shift, care signed out to Dr. Jacqulyn Bath pending results   Eber Hong, MD 11/07/20 [id number]

## 2020-11-07 NOTE — ED Notes (Signed)
Patient transported to MRI 

## 2020-11-07 NOTE — Discharge Instructions (Signed)
You were seen in the emergency room today for evaluation of neurologic symptoms.  Your MRI shows inflammation of nerves likely related to a viral illness.  We are starting you on 5 days of steroid and will have you follow closely with a neurologist.  I listed the name of a neurologist and also placed a referral in our system to assist with getting an appointment.  Return to the emergency department any new or suddenly worsening symptoms.

## 2020-11-07 NOTE — ED Provider Notes (Signed)
Blood pressure (!) 93/53, pulse 72, temperature 98.6 F (37 C), temperature source Oral, resp. rate 18, height 5' 4.5" (1.638 m), weight 63.5 kg, SpO2 98 %.  Assuming care from Dr. Hyacinth Meeker.  In short, Aimee Cook is a 49 y.o. female with a chief complaint of Numbness and Eye Problem .  Refer to the original H&P for additional details.  The current plan of care is to f/u with Neurology after MRI.  MRI reviewed. Discussed with Dr. Amada Jupiter. Plan for steroid burst. Dosing instructions discussed. Patient to take Valtrex per PCP visit earlier. Gave contact and referral info for Neurology. Ready for d/c.    Maia Plan, MD 11/12/20 1024

## 2020-11-07 NOTE — ED Notes (Signed)
Provider okay with patient not being on the monitor at this time. This RN will continue to monitor the patient.

## 2020-11-07 NOTE — ED Notes (Signed)
Report given to this RN by Vicenta Aly, RN. Pt currently in MRI. Physical assessment and repeat VS reassessment to be completed upon patients return from MRI.

## 2020-11-07 NOTE — ED Notes (Signed)
Pt refused to be on continuous cardiac monitoring. Per patient she states the doctor told me I don't need to be attached to all those wires. Pt agreed to let me get a full set of vitals upon my arrival.

## 2020-11-08 LAB — ANGIOTENSIN CONVERTING ENZYME: Angiotensin-Converting Enzyme: 36 U/L (ref 14–82)

## 2020-11-09 LAB — SJOGRENS SYNDROME-A EXTRACTABLE NUCLEAR ANTIBODY: SSA (Ro) (ENA) Antibody, IgG: <0.2 AI (ref 0.0–0.9)

## 2020-11-09 LAB — MPO/PR-3 (ANCA) ANTIBODIES
ANCA Proteinase 3: <3.5 U/mL (ref 0.0–3.5)
Myeloperoxidase Abs: <9 U/mL (ref 0.0–9.0)

## 2020-11-09 LAB — ANA W/REFLEX IF POSITIVE: Anti Nuclear Antibody (ANA): NEGATIVE

## 2020-11-09 LAB — SJOGRENS SYNDROME-B EXTRACTABLE NUCLEAR ANTIBODY: SSB (La) (ENA) Antibody, IgG: <0.2 AI (ref 0.0–0.9)

## 2020-11-11 LAB — B. BURGDORFI ANTIBODIES: B burgdorferi Ab IgG+IgM: <0.91 {ISR} (ref 0.00–0.90)

## 2020-11-11 LAB — ANCA TITERS
Atypical P-ANCA titer: <1:20 {titer}
C-ANCA: <1:20 {titer}
P-ANCA: <1:20 {titer}

## 2020-11-12 ENCOUNTER — Ambulatory Visit: Payer: Self-pay | Admitting: Neurology

## 2020-11-12 ENCOUNTER — Ambulatory Visit: Payer: BC Managed Care – PPO | Admitting: Neurology

## 2020-11-12 ENCOUNTER — Encounter: Payer: Self-pay | Admitting: Neurology

## 2020-11-12 VITALS — BP 126/81 | HR 76 | Ht 64.5 in | Wt 146.5 lb

## 2020-11-12 DIAGNOSIS — R200 Anesthesia of skin: Secondary | ICD-10-CM

## 2020-11-12 NOTE — Progress Notes (Signed)
Chief Complaint  Patient presents with  . New Patient (Initial Visit)    ED follow up from 11/06/20. Presented with right-sided facial numbness, right eye pain and blurred vision. She would like to further review the results of the completed scans. She is on day five of Prednisone 22m, three tablets daily and Valtrex 10035mTID. She is still having the numbness. Her eye pain has improved.   . Aimee ShadowMD (has not seen PCP in over 5 years)  . Ophthalmologist    Aimee MutterMD    HISTORICAL  Aimee FIORENZAs a [age]ear old female, seen in request by her ophthalmologist Aimee Salinaor evaluation of right facial numbness, right eye pain, blurry vision, initial evaluation was on November 12, 2020.  I reviewed and summarized the referring note. She had a history of lumbar L5-S1 fusion surgery in 2016.  On October 25, 2020, she began to notice right eye brow pain, if she lift her eyebrow, she felt painful, when her glasses touch her right eyebrow, she also felt painful.  She had a history of bilateral retinal detachment in the past, had right eye laser surgery in August 2021, presented with seen flash, floater,  But this time, she only had right eyebrow pain, but no flashing, she was seen by ophthalmologist, was diagnosed with dry eye, offered eyedrops.  Next day on November 23, she noticed more numbness spreading to her right forehead, even right cheek, jaw area by November 24  She was diagnosed with possible shingles, without outbreak, was given a prescription of Valtrex 1000 mg 3 times a day, was also started on prednisone tapering since November 26.  Her pain is overall has much better, but still feels numbness, at the right eyebrow, frontal area, close to hairline, behind right ear, no double vision,  I personally reviewed MRI of the brain with without contrast, MRI of the brain with internal acoustic canal with without contrast, orbit with  without contrast on November 07, 2020: There was described subtle enhancement about the fundal aspect of right internal acoustic canal, as well as the mastoid segment of the right facial nerve, with additional suspected subtle enhancement about the right trigeminal ganglion, in the right V2 segment, radiology reads the possibility of post virus neuritis, otherwise no significant abnormalities.  Laboratory evaluation November 2021: Negative Lyme titer, RPR, ANA, Sjogren A, B, C-reactive protein angiotensin-converting enzyme, ESR, BMP, CBC showed hemoglobin of 11.9  REVIEW OF SYSTEMS: Full 14 system review of systems performed and notable only for of All other review of systems were negative.  ALLERGIES: Allergies  Allergen Reactions  . Erythromycin Other (See Comments)    GI discomfort and almost paralyzed patient  . Iodine Anaphylaxis  . Shellfish Allergy Anaphylaxis  . Lanolin Hives  . Aspirin Other (See Comments)    Childhood allergy    HOME MEDICATIONS: Current Outpatient Medications  Medication Sig Dispense Refill  . beclomethasone (QVAR) 80 MCG/ACT inhaler Inhale 1 puff into the lungs at bedtime as needed (wheezing).     . cycloSPORINE (RESTASIS) 0.05 % ophthalmic emulsion Place 1 drop into both eyes 2 (two) times daily.    . Marland Kitchenoxycycline (VIBRAMYCIN) 50 MG capsule Take 50 mg by mouth daily.    . Flaxseed, Linseed, (FLAX SEED OIL) 1000 MG CAPS Take 1,000 mg by mouth daily.    . Glucosamine 750 MG TABS Take 750 mg by mouth daily.    . Multiple Vitamins-Calcium (ONE-A-DAY WOMENS FORMULA  PO) Take 1 tablet by mouth daily.    . polycarbophil (FIBERCON) 625 MG tablet Take 1,250 mg by mouth daily.    [name] Apply 1 drop to eye 4 (four) times daily as needed (dry eyes).    . predniSONE (DELTASONE) 20 MG tablet Take 3 tablets (60 mg total) by mouth daily for 5 days. 15 tablet 0  . Probiotic Product (PROBIOTIC PO) Take 1 capsule by  mouth at bedtime.    . SODIUM FLUORIDE 5000 PLUS 1.1 % CREA dental cream Place 1 application onto teeth at bedtime.     . tacrolimus (PROTOPIC) 0.1 % ointment Apply 1 application topically at bedtime as needed (rash).     . valACYclovir (VALTREX) 1000 MG tablet Take 1,000 mg by mouth 3 (three) times daily.     No current facility-administered medications for this visit.    PAST MEDICAL HISTORY: Past Medical History:  Diagnosis Date  . Anemia    mild  . Asthma   . Dental crowns present   . Facial numbness   . Hemorrhoids   . History of E. coli septicemia 01/1996  . Joint pain    "back"  . Rosacea   . Seasonal allergies   . Wears glasses     PAST SURGICAL HISTORY: Past Surgical History:  Procedure Laterality Date  . DILATION AND CURETTAGE OF UTERUS  2012  . RETINAL LASER PROCEDURE Bilateral   . SPINAL FUSION  11/2015  . WISDOM TOOTH EXTRACTION      FAMILY HISTORY: Family History  Problem Relation Age of Onset  . Ulcerative colitis Mother   . Diabetes Father   . Hypertension Father   . Kidney Stones Father   . Heart disease Father   . Alzheimer's disease Maternal Grandmother   . Colon cancer Maternal Grandfather   . Breast cancer Paternal Grandmother   . Lung cancer Paternal Grandfather   . Breast cancer Paternal Aunt     SOCIAL HISTORY: Social History   Socioeconomic History  . Marital status: Married    Spouse name: Not on file  . Number of children: 1  . Years of education: college  . Highest education level: Professional school degree (e.g., MD, DDS, DVM, JD)  Occupational History  . Occupation: attorney  Tobacco Use  . Smoking status: Never Smoker  . Smokeless tobacco: Never Used  Substance and Sexual Activity  . Alcohol use: Yes    Comment: socially  . Drug use: Never  . Sexual activity: Yes    Birth control/protection: None  Other Topics Concern  . Not on file  Social History Narrative   Lives at home with her husband.   Right-handed.   One  cup coffee, 3-4 days each week.    Social Determinants of Health   Financial Resource Strain:   . Difficulty of Paying Living Expenses: Not on file  Food Insecurity:   . Worried About Charity fundraiser in the Last Year: Not on file  . Ran Out of Food in the Last Year: Not on file  Transportation Needs:   . Lack of Transportation (Medical): Not on file  . Lack of Transportation (Non-Medical): Not on file  Physical Activity:   . Days of Exercise per Week: Not on file  . Minutes of Exercise per Session: Not on file  Stress:   . Feeling of Stress : Not on file  Social Connections:   . Frequency of Communication with Friends and  Family: Not on file  . Frequency of Social Gatherings with Friends and Family: Not on file  . Attends Religious Services: Not on file  . Active Member of Clubs or Organizations: Not on file  . Attends Archivist Meetings: Not on file  . Marital Status: Not on file  Intimate Partner Violence:   . Fear of Current or Ex-Partner: Not on file  . Emotionally Abused: Not on file  . Physically Abused: Not on file  . Sexually Abused: Not on file     PHYSICAL EXAM   Vitals:   11/12/20 1515  BP: 126/81  Pulse: 76  Weight: 146 lb 8 oz (66.5 kg)  Height: 5' 4.5" (1.638 m)   Not recorded     Body mass index is 24.76 kg/m.  PHYSICAL EXAMNIATION:  Gen: NAD, conversant, well nourised, well groomed                     Cardiovascular: Regular rate rhythm, no peripheral edema, warm, nontender. Eyes: Conjunctivae clear without exudates or hemorrhage Neck: Supple, no carotid bruits. Pulmonary: Clear to auscultation bilaterally   NEUROLOGICAL EXAM:  MENTAL STATUS: Speech:    Speech is normal; fluent and spontaneous with normal comprehension.  Cognition:     Orientation to time, place and person     Normal recent and remote memory     Normal Attention span and concentration     Normal Language, naming, repeating,spontaneous speech     Fund of  knowledge   CRANIAL NERVES: CN II: Visual fields are full to confrontation. Pupils are round equal and briskly reactive to light. CN III, IV, VI: extraocular movement are normal. No ptosis. CN V: Facial sensation is intact to light touch CN VII: Face is symmetric with normal eye closure  CN VIII: Hearing is normal to causal conversation. CN IX, X: Phonation is normal. CN XI: Head turning and shoulder shrug are intact  MOTOR: There is no pronator drift of out-stretched arms. Muscle bulk and tone are normal. Muscle strength is normal.  REFLEXES: Reflexes are 2+ and symmetric at the biceps, triceps, knees, and ankles. Plantar responses are flexor.  SENSORY: Intact to light touch, pinprick and vibratory sensation are intact in fingers and toes.  COORDINATION: There is no trunk or limb dysmetria noted.  GAIT/STANCE: Posture is normal. Gait is steady with normal steps, base, arm swing, and turning. Heel and toe walking are normal. Tandem gait is normal.  Romberg is absent.   DIAGNOSTIC DATA (LABS, IMAGING, TESTING) - I reviewed patient records, labs, notes, testing and imaging myself where available.   ASSESSMENT AND PLAN  Aimee Cook is a 49 y.o. female   Right facial paresthesia,  Subtle abnormality on MRI of the brain  Complete treatment cycle Valtrex, and prednisone,  With described abnormal findings on MRI, I have raised the possibility of lumbar puncture to rule out active infection, inflammation, patient wants observation at this time, will contact office for worsening symptoms  Marcial Pacas, M.D. Ph.D.  [facility] [address] [place], [place] [address] Ph: [phone] Fax: [phone]  CC:  Long, [address], MD [place],  [place] [address]  Servando Salina, MD

## 2020-11-21 DIAGNOSIS — M545 Low back pain, unspecified: Secondary | ICD-10-CM | POA: Diagnosis not present

## 2020-11-21 DIAGNOSIS — M4317 Spondylolisthesis, lumbosacral region: Secondary | ICD-10-CM | POA: Diagnosis not present

## 2020-11-26 ENCOUNTER — Other Ambulatory Visit: Payer: BC Managed Care – PPO

## 2020-12-17 ENCOUNTER — Ambulatory Visit: Payer: BC Managed Care – PPO | Admitting: Neurology

## 2021-02-04 DIAGNOSIS — J309 Allergic rhinitis, unspecified: Secondary | ICD-10-CM | POA: Diagnosis not present

## 2021-02-13 DIAGNOSIS — R0781 Pleurodynia: Secondary | ICD-10-CM | POA: Diagnosis not present

## 2021-04-07 DIAGNOSIS — L723 Sebaceous cyst: Secondary | ICD-10-CM | POA: Diagnosis not present

## 2021-04-07 DIAGNOSIS — L710 Perioral dermatitis: Secondary | ICD-10-CM | POA: Diagnosis not present

## 2021-09-23 ENCOUNTER — Ambulatory Visit
Admission: RE | Admit: 2021-09-23 | Discharge: 2021-09-23 | Disposition: A | Discharge status: Home / Self Care | Payer: BC Managed Care – PPO | Source: Ambulatory Visit | Attending: Obstetrics and Gynecology | Admitting: Obstetrics and Gynecology

## 2021-09-23 ENCOUNTER — Other Ambulatory Visit: Payer: Self-pay | Admitting: Obstetrics and Gynecology

## 2021-09-23 ENCOUNTER — Other Ambulatory Visit: Payer: Self-pay

## 2021-09-23 DIAGNOSIS — Z1231 Encounter for screening mammogram for malignant neoplasm of breast: Secondary | ICD-10-CM

## 2021-11-03 DIAGNOSIS — H04123 Dry eye syndrome of bilateral lacrimal glands: Secondary | ICD-10-CM | POA: Diagnosis not present

## 2021-11-03 DIAGNOSIS — H33303 Unspecified retinal break, bilateral: Secondary | ICD-10-CM | POA: Diagnosis not present

## 2021-11-11 DIAGNOSIS — L814 Other melanin hyperpigmentation: Secondary | ICD-10-CM | POA: Diagnosis not present

## 2021-11-11 DIAGNOSIS — Z86018 Personal history of other benign neoplasm: Secondary | ICD-10-CM | POA: Diagnosis not present

## 2021-11-11 DIAGNOSIS — L821 Other seborrheic keratosis: Secondary | ICD-10-CM | POA: Diagnosis not present

## 2021-11-11 DIAGNOSIS — D225 Melanocytic nevi of trunk: Secondary | ICD-10-CM | POA: Diagnosis not present

## 2021-11-11 DIAGNOSIS — L820 Inflamed seborrheic keratosis: Secondary | ICD-10-CM | POA: Diagnosis not present

## 2021-12-23 DIAGNOSIS — Z01419 Encounter for gynecological examination (general) (routine) without abnormal findings: Secondary | ICD-10-CM | POA: Diagnosis not present

## 2021-12-23 DIAGNOSIS — K648 Other hemorrhoids: Secondary | ICD-10-CM | POA: Diagnosis not present

## 2021-12-23 DIAGNOSIS — N9489 Other specified conditions associated with female genital organs and menstrual cycle: Secondary | ICD-10-CM | POA: Diagnosis not present

## 2021-12-23 DIAGNOSIS — N83201 Unspecified ovarian cyst, right side: Secondary | ICD-10-CM | POA: Diagnosis not present

## 2022-01-27 DIAGNOSIS — N83291 Other ovarian cyst, right side: Secondary | ICD-10-CM | POA: Diagnosis not present

## 2022-01-27 DIAGNOSIS — K625 Hemorrhage of anus and rectum: Secondary | ICD-10-CM | POA: Diagnosis not present

## 2022-01-27 DIAGNOSIS — Z800 Family history of malignant neoplasm of digestive organs: Secondary | ICD-10-CM | POA: Diagnosis not present

## 2022-01-27 DIAGNOSIS — Z1211 Encounter for screening for malignant neoplasm of colon: Secondary | ICD-10-CM | POA: Diagnosis not present

## 2022-02-04 DIAGNOSIS — K640 First degree hemorrhoids: Secondary | ICD-10-CM | POA: Diagnosis not present

## 2022-02-17 DIAGNOSIS — R7989 Other specified abnormal findings of blood chemistry: Secondary | ICD-10-CM | POA: Diagnosis not present

## 2022-02-17 DIAGNOSIS — J309 Allergic rhinitis, unspecified: Secondary | ICD-10-CM | POA: Diagnosis not present

## 2022-02-17 DIAGNOSIS — Z1331 Encounter for screening for depression: Secondary | ICD-10-CM | POA: Diagnosis not present

## 2022-02-17 DIAGNOSIS — R82998 Other abnormal findings in urine: Secondary | ICD-10-CM | POA: Diagnosis not present

## 2022-02-17 DIAGNOSIS — Z0000 Encounter for general adult medical examination without abnormal findings: Secondary | ICD-10-CM | POA: Diagnosis not present

## 2022-02-17 DIAGNOSIS — Z23 Encounter for immunization: Secondary | ICD-10-CM | POA: Diagnosis not present

## 2022-02-17 DIAGNOSIS — Z1339 Encounter for screening examination for other mental health and behavioral disorders: Secondary | ICD-10-CM | POA: Diagnosis not present

## 2022-04-03 DIAGNOSIS — K6389 Other specified diseases of intestine: Secondary | ICD-10-CM | POA: Diagnosis not present

## 2022-04-03 DIAGNOSIS — D123 Benign neoplasm of transverse colon: Secondary | ICD-10-CM | POA: Diagnosis not present

## 2022-04-03 DIAGNOSIS — K648 Other hemorrhoids: Secondary | ICD-10-CM | POA: Diagnosis not present

## 2022-04-03 DIAGNOSIS — K635 Polyp of colon: Secondary | ICD-10-CM | POA: Diagnosis not present

## 2022-04-03 DIAGNOSIS — K633 Ulcer of intestine: Secondary | ICD-10-CM | POA: Diagnosis not present

## 2022-04-03 DIAGNOSIS — Z1211 Encounter for screening for malignant neoplasm of colon: Secondary | ICD-10-CM | POA: Diagnosis not present

## 2022-06-29 DIAGNOSIS — B880 Other acariasis: Secondary | ICD-10-CM | POA: Diagnosis not present

## 2022-07-23 ENCOUNTER — Ambulatory Visit (HOSPITAL_COMMUNITY)
Admission: RE | Admit: 2022-07-23 | Discharge: 2022-07-23 | Disposition: A | Discharge status: Home / Self Care | Payer: BC Managed Care – PPO | Source: Ambulatory Visit | Attending: Internal Medicine | Admitting: Internal Medicine

## 2022-07-23 ENCOUNTER — Encounter (HOSPITAL_COMMUNITY): Payer: Self-pay

## 2022-07-23 VITALS — BP 130/78 | HR 78 | Temp 98.8°F | Resp 18

## 2022-07-23 DIAGNOSIS — H6593 Unspecified nonsuppurative otitis media, bilateral: Secondary | ICD-10-CM | POA: Diagnosis not present

## 2022-07-23 DIAGNOSIS — H9203 Otalgia, bilateral: Secondary | ICD-10-CM

## 2022-07-23 MED ORDER — CEFDINIR 300 MG PO CAPS: 300.0000 mg | ORAL_CAPSULE | Freq: Two times a day (BID) | ORAL | 0 refills | Status: AC

## 2022-07-23 MED ORDER — FLUTICASONE PROPIONATE 50 MCG/ACT NA SUSP: 1.0000 | Freq: Every day | NASAL | 0 refills | Status: AC

## 2022-07-23 MED ORDER — GUAIFENESIN ER 600 MG PO TB12: 600.0000 mg | ORAL_TABLET | Freq: Two times a day (BID) | ORAL | 0 refills | Status: AC

## 2022-07-23 NOTE — ED Triage Notes (Signed)
Pt c/o lt ear pain after flying from [place] on Tuesday. States started having congestion on Saturday and took decongestants.

## 2022-07-23 NOTE — ED Provider Notes (Signed)
[facility]    CSN: [id number] Arrival date & time: 07/23/22  1438      History   Chief Complaint Chief Complaint  Patient presents with   Ear Injury    Traveled to [place] Tuesday night via plane while congested (took decongestant) and have sever ear pain and sounds like fluid is inside ear. Pain has gotten worse since arriving in [place]. I suspect I have an ear infection. - Entered by patient   Otalgia    HPI Aimee Cook is a 51 y.o. female comes to the urgent care with bilateral ear pain which started after air travel on Tuesday.  Patient returned on a flight from [place].  She has had bilateral ear pain with a sensation of fullness in the right ear and a constant sharp/throbbing pain in the left ear.  Hearing is diminished in the left ear.  No ringing in either ears.  Patient endorses nasal congestion but denies any postnasal drainage.  She has been using oxymetazoline as well as phenylephrine.  No fever or chills.  No discharge or bleeding from either ears.  HPI  Past Medical History:  Diagnosis Date   Anemia    mild   Asthma    Dental crowns present    Facial numbness    Hemorrhoids    History of E. coli septicemia 01/1996   Joint pain    "back"   Rosacea    Seasonal allergies    Wears glasses     Patient Active Problem List   Diagnosis Date Noted   Right facial numbness 11/12/2020   Congenital spondylolisthesis of lumbar region 11/25/2015    Past Surgical History:  Procedure Laterality Date   DILATION AND CURETTAGE OF UTERUS  2012   RETINAL LASER PROCEDURE Bilateral    SPINAL FUSION  11/2015   WISDOM TOOTH EXTRACTION      OB History   No obstetric history on file.      Home Medications    Prior to Admission medications   Medication Sig Start Date End Date Taking? Authorizing Provider  cefdinir (OMNICEF) 300 MG capsule Take 1 capsule (300 mg total) by mouth 2 (two) times daily for 7 days. 07/23/22 07/30/22 Yes Thaddeaus Monica, Britta Mccreedy, MD   fluticasone (FLONASE) 50 MCG/ACT nasal spray Place 1 spray into both nostrils daily for 10 days. 07/23/22 08/02/22 Yes Ilyanna Baillargeon, Britta Mccreedy, MD  guaiFENesin (MUCINEX) 600 MG 12 hr tablet Take 1 tablet (600 mg total) by mouth 2 (two) times daily for 10 days. 07/23/22 08/02/22 Yes Lynett Brasil, Britta Mccreedy, MD  beclomethasone (QVAR) 80 MCG/ACT inhaler Inhale 1 puff into the lungs at bedtime as needed (wheezing).     [provider]  cycloSPORINE (RESTASIS) 0.05 % ophthalmic emulsion Place 1 drop into both eyes 2 (two) times daily.    [provider]  doxycycline (VIBRAMYCIN) 50 MG capsule Take 50 mg by mouth daily. 11/05/20   [provider]  Flaxseed, Linseed, (FLAX SEED OIL) 1000 MG CAPS Take 1,000 mg by mouth daily.    [provider]  Glucosamine 750 MG TABS Take 750 mg by mouth daily.    [provider]  Multiple Vitamins-Calcium (ONE-A-DAY WOMENS FORMULA PO) Take 1 tablet by mouth daily.    [provider]  polycarbophil (FIBERCON) 625 MG tablet Take 1,250 mg by mouth daily.    [provider]  Polyethyl Glycol-Propyl Glycol (SYSTANE PRESERVATIVE FREE OP) Apply 1 drop to eye 4 (four) times daily as  needed (dry eyes).    [provider]  Probiotic Product (PROBIOTIC PO) Take 1 capsule by mouth at bedtime.    [provider]  SODIUM FLUORIDE 5000 PLUS 1.1 % CREA dental cream Place 1 application onto teeth at bedtime.  10/17/20   [provider]  tacrolimus (PROTOPIC) 0.1 % ointment Apply 1 application topically at bedtime as needed (rash).     [provider]  valACYclovir (VALTREX) 1000 MG tablet Take 1,000 mg by mouth 3 (three) times daily. 11/06/20   [provider]    Family History Family History  Problem Relation Age of Onset   Ulcerative colitis Mother    Diabetes Father    Hypertension Father    Kidney Stones Father    Heart disease Father    Alzheimer's disease Maternal Grandmother     Colon cancer Maternal Grandfather    Breast cancer Paternal Grandmother    Lung cancer Paternal Grandfather    Breast cancer Paternal Aunt     Social History Social History   Tobacco Use   Smoking status: Never   Smokeless tobacco: Never  Substance Use Topics   Alcohol use: Yes    Comment: socially   Drug use: Never     Allergies   Erythromycin, Iodine, Shellfish allergy, Lanolin, and Aspirin   Review of Systems Review of Systems  Constitutional: Negative.   HENT:  Positive for congestion and ear pain. Negative for ear discharge, facial swelling and sore throat.   Respiratory: Negative.    Cardiovascular: Negative.   Gastrointestinal: Negative.      Physical Exam Triage Vital Signs ED Triage Vitals  Enc Vitals Group     BP 07/23/22 1514 130/78     Pulse Rate 07/23/22 1514 78     Resp 07/23/22 1514 18     Temp 07/23/22 1514 98.8 F (37.1 C)     Temp Source 07/23/22 1514 Oral     SpO2 07/23/22 1514 98 %     Weight --      Height --      Head Circumference --      Peak Flow --      Pain Score 07/23/22 1515 3     Pain Loc --      Pain Edu? --      Excl. in GC? --    No data found.  Updated Vital Signs BP 130/78 (BP Location: Left Arm)   Pulse 78   Temp 98.8 F (37.1 C) (Oral)   Resp 18   SpO2 98%   Visual Acuity Right Eye Distance:   Left Eye Distance:   Bilateral Distance:    Right Eye Near:   Left Eye Near:    Bilateral Near:     Physical Exam Vitals and nursing note reviewed.  Constitutional:      General: She is not in acute distress.    Appearance: She is not ill-appearing.  HENT:     Ears:     Comments: Right ear exam reveals bulging erythematous.  Left tympanic membrane is erythematous,retracted with minimal middle ear effusion with purulent middle ear effusion.  No external ear canal erythema bilaterally. Musculoskeletal:     Cervical back: Normal range of motion and neck supple.  Neurological:     Mental Status: She is alert.       UC Treatments / Results  Labs (all labs ordered are listed, but only abnormal results are displayed) Labs Reviewed - No data to display  EKG   Radiology No results found.  Procedures Procedures (including critical care time)  Medications Ordered in UC Medications - No data to display  Initial Impression / Assessment and Plan / UC Course  I have reviewed the triage vital signs and the nursing notes.  Pertinent labs & imaging results that were available during my care of the patient were reviewed by me and considered in my medical decision making (see chart for details).     1.  Bilateral nonsuppurative otitis media: Augmentin, Flonase and Mucinex. Discontinue phenylephrine and oxymetazoline Maintain adequate hydration Humidifier use with help with nasal congestion and postnasal drainage Return to urgent care if symptoms worsen. Final Clinical Impressions(s) / UC Diagnoses   Final diagnoses:  Bilateral non-suppurative otitis media  Otalgia of both ears     Discharge Instructions      Please use medications as prescribed Ibuprofen as needed for pain If you have worsening ear pain, drainage out of both ears or hearing loss please return to urgent care to be reevaluated Maintain adequate hydration Please stop using Afrin nasal spray. Humidifier use at bedtime will help with nasal congestion.   ED Prescriptions     Medication Sig Dispense Auth. Provider   fluticasone (FLONASE) 50 MCG/ACT nasal spray Place 1 spray into both nostrils daily for 10 days. 9.9 mL Shaquita Fort, Britta Mccreedy, MD   guaiFENesin (MUCINEX) 600 MG 12 hr tablet Take 1 tablet (600 mg total) by mouth 2 (two) times daily for 10 days. 20 tablet Mitsugi Schrader, Britta Mccreedy, MD   cefdinir (OMNICEF) 300 MG capsule Take 1 capsule (300 mg total) by mouth 2 (two) times daily for 7 days. 14 capsule Aleasha Fregeau, Britta Mccreedy, MD      PDMP not reviewed this encounter.   Merrilee Jansky, MD 07/25/22 [id number]

## 2022-07-23 NOTE — Discharge Instructions (Addendum)
Please use medications as prescribed Ibuprofen as needed for pain If you have worsening ear pain, drainage out of both ears or hearing loss please return to urgent care to be reevaluated Maintain adequate hydration Please stop using Afrin nasal spray. Humidifier use at bedtime will help with nasal congestion.

## 2022-07-27 DIAGNOSIS — H6693 Otitis media, unspecified, bilateral: Secondary | ICD-10-CM | POA: Diagnosis not present

## 2022-08-07 DIAGNOSIS — H6983 Other specified disorders of Eustachian tube, bilateral: Secondary | ICD-10-CM | POA: Diagnosis not present

## 2022-08-07 DIAGNOSIS — H9313 Tinnitus, bilateral: Secondary | ICD-10-CM | POA: Diagnosis not present

## 2022-08-10 ENCOUNTER — Other Ambulatory Visit: Payer: Self-pay | Admitting: Obstetrics and Gynecology

## 2022-08-10 DIAGNOSIS — Z1231 Encounter for screening mammogram for malignant neoplasm of breast: Secondary | ICD-10-CM

## 2022-09-01 DIAGNOSIS — Z23 Encounter for immunization: Secondary | ICD-10-CM | POA: Diagnosis not present

## 2022-09-24 ENCOUNTER — Ambulatory Visit: Payer: BC Managed Care – PPO

## 2022-09-24 ENCOUNTER — Ambulatory Visit
Admission: RE | Admit: 2022-09-24 | Discharge: 2022-09-24 | Disposition: A | Discharge status: Home / Self Care | Payer: BC Managed Care – PPO | Source: Ambulatory Visit | Attending: Obstetrics and Gynecology | Admitting: Obstetrics and Gynecology

## 2022-09-24 DIAGNOSIS — Z1231 Encounter for screening mammogram for malignant neoplasm of breast: Secondary | ICD-10-CM

## 2022-11-04 DIAGNOSIS — H33303 Unspecified retinal break, bilateral: Secondary | ICD-10-CM | POA: Diagnosis not present

## 2022-11-04 DIAGNOSIS — H5213 Myopia, bilateral: Secondary | ICD-10-CM | POA: Diagnosis not present

## 2022-11-18 DIAGNOSIS — L820 Inflamed seborrheic keratosis: Secondary | ICD-10-CM | POA: Diagnosis not present

## 2022-11-18 DIAGNOSIS — D225 Melanocytic nevi of trunk: Secondary | ICD-10-CM | POA: Diagnosis not present

## 2022-11-18 DIAGNOSIS — L814 Other melanin hyperpigmentation: Secondary | ICD-10-CM | POA: Diagnosis not present

## 2022-11-18 DIAGNOSIS — L821 Other seborrheic keratosis: Secondary | ICD-10-CM | POA: Diagnosis not present

## 2022-11-18 DIAGNOSIS — Z86018 Personal history of other benign neoplasm: Secondary | ICD-10-CM | POA: Diagnosis not present

## 2023-03-01 DIAGNOSIS — R002 Palpitations: Secondary | ICD-10-CM | POA: Diagnosis not present

## 2023-04-16 DIAGNOSIS — J309 Allergic rhinitis, unspecified: Secondary | ICD-10-CM | POA: Diagnosis not present

## 2023-04-16 DIAGNOSIS — Z0000 Encounter for general adult medical examination without abnormal findings: Secondary | ICD-10-CM | POA: Diagnosis not present

## 2023-04-16 DIAGNOSIS — M3500 Sicca syndrome, unspecified: Secondary | ICD-10-CM | POA: Diagnosis not present

## 2023-04-16 DIAGNOSIS — Z1331 Encounter for screening for depression: Secondary | ICD-10-CM | POA: Diagnosis not present

## 2023-04-16 DIAGNOSIS — Z91013 Allergy to seafood: Secondary | ICD-10-CM | POA: Diagnosis not present

## 2023-04-16 DIAGNOSIS — L709 Acne, unspecified: Secondary | ICD-10-CM | POA: Diagnosis not present

## 2023-04-16 DIAGNOSIS — R82998 Other abnormal findings in urine: Secondary | ICD-10-CM | POA: Diagnosis not present

## 2023-04-16 DIAGNOSIS — Z1339 Encounter for screening examination for other mental health and behavioral disorders: Secondary | ICD-10-CM | POA: Diagnosis not present

## 2023-08-11 ENCOUNTER — Other Ambulatory Visit: Payer: Self-pay | Admitting: Obstetrics and Gynecology

## 2023-08-11 DIAGNOSIS — Z0000 Encounter for general adult medical examination without abnormal findings: Secondary | ICD-10-CM

## 2023-09-06 ENCOUNTER — Ambulatory Visit: Payer: Self-pay | Admitting: General Surgery

## 2023-09-06 DIAGNOSIS — K6289 Other specified diseases of anus and rectum: Secondary | ICD-10-CM | POA: Diagnosis not present

## 2023-09-09 DIAGNOSIS — Z23 Encounter for immunization: Secondary | ICD-10-CM | POA: Diagnosis not present

## 2023-10-01 ENCOUNTER — Ambulatory Visit
Admission: RE | Admit: 2023-10-01 | Discharge: 2023-10-01 | Disposition: A | Discharge status: Home / Self Care | Payer: BC Managed Care – PPO | Source: Ambulatory Visit | Attending: Obstetrics and Gynecology | Admitting: Obstetrics and Gynecology

## 2023-10-01 DIAGNOSIS — Z1231 Encounter for screening mammogram for malignant neoplasm of breast: Secondary | ICD-10-CM | POA: Diagnosis not present

## 2023-10-01 DIAGNOSIS — Z0000 Encounter for general adult medical examination without abnormal findings: Secondary | ICD-10-CM

## 2023-10-25 ENCOUNTER — Encounter (HOSPITAL_BASED_OUTPATIENT_CLINIC_OR_DEPARTMENT_OTHER): Payer: Self-pay | Admitting: General Surgery

## 2023-10-26 ENCOUNTER — Encounter (HOSPITAL_COMMUNITY): Payer: Self-pay | Admitting: *Deleted

## 2023-10-26 NOTE — Progress Notes (Signed)
Spoke w/ via phone for pre-op interview--- pt Lab needs dos----  no        Lab results------ no COVID test -----patient states asymptomatic no test needed Arrive at ------- 0530 on 10-28-2023 NPO after MN NO Solid Food.  Clear liquids from MN until--- 0430 Med rec completed Medications to take morning of surgery ----- eye drops Diabetic medication ----- n/a Patient instructed no nail polish to be worn day of surgery Patient instructed to bring photo id and insurance card day of surgery Patient aware to have Driver (ride ) / caregiver    for 24 hours after surgery - husband, Aimee Cook Patient Special Instructions ----- asked to bring both inhaler's dos Pre-Op special Instructions ----- n/a Patient verbalized understanding of instructions that were given at this phone interview. Patient denies chest pain, sob, fever, cough at the interview.

## 2023-10-28 ENCOUNTER — Encounter (HOSPITAL_BASED_OUTPATIENT_CLINIC_OR_DEPARTMENT_OTHER): Payer: Self-pay | Admitting: General Surgery

## 2023-10-28 ENCOUNTER — Ambulatory Visit (HOSPITAL_BASED_OUTPATIENT_CLINIC_OR_DEPARTMENT_OTHER): Payer: Self-pay | Admitting: Certified Registered Nurse Anesthetist

## 2023-10-28 ENCOUNTER — Other Ambulatory Visit: Payer: Self-pay

## 2023-10-28 ENCOUNTER — Encounter (HOSPITAL_BASED_OUTPATIENT_CLINIC_OR_DEPARTMENT_OTHER)
Admission: RE | Disposition: A | Discharge status: Home / Self Care | Payer: Self-pay | Source: Home / Self Care | Attending: General Surgery

## 2023-10-28 ENCOUNTER — Ambulatory Visit (HOSPITAL_BASED_OUTPATIENT_CLINIC_OR_DEPARTMENT_OTHER)
Admission: RE | Admit: 2023-10-28 | Discharge: 2023-10-28 | Disposition: A | Discharge status: Home / Self Care | Payer: BC Managed Care – PPO | Attending: General Surgery | Admitting: General Surgery

## 2023-10-28 DIAGNOSIS — J45909 Unspecified asthma, uncomplicated: Secondary | ICD-10-CM | POA: Diagnosis not present

## 2023-10-28 DIAGNOSIS — K640 First degree hemorrhoids: Secondary | ICD-10-CM | POA: Insufficient documentation

## 2023-10-28 DIAGNOSIS — Z7951 Long term (current) use of inhaled steroids: Secondary | ICD-10-CM | POA: Diagnosis not present

## 2023-10-28 DIAGNOSIS — Z01818 Encounter for other preprocedural examination: Secondary | ICD-10-CM

## 2023-10-28 DIAGNOSIS — K625 Hemorrhage of anus and rectum: Secondary | ICD-10-CM | POA: Diagnosis not present

## 2023-10-28 DIAGNOSIS — K6289 Other specified diseases of anus and rectum: Secondary | ICD-10-CM | POA: Insufficient documentation

## 2023-10-28 DIAGNOSIS — D649 Anemia, unspecified: Secondary | ICD-10-CM | POA: Insufficient documentation

## 2023-10-28 DIAGNOSIS — K648 Other hemorrhoids: Secondary | ICD-10-CM | POA: Diagnosis not present

## 2023-10-28 HISTORY — DX: Other specified diseases of anus and rectum: K62.89

## 2023-10-28 HISTORY — DX: Unspecified asthma, uncomplicated: J45.909

## 2023-10-28 HISTORY — DX: Personal history of other diseases of the nervous system and sense organs: Z86.69

## 2023-10-28 HISTORY — DX: Personal history of other specified conditions: Z87.898

## 2023-10-28 HISTORY — PX: RECTAL EXAM UNDER ANESTHESIA: SHX6399

## 2023-10-28 HISTORY — PX: RECTAL BIOPSY: SHX2303

## 2023-10-28 HISTORY — DX: Nausea with vomiting, unspecified: Z98.890

## 2023-10-28 HISTORY — DX: Other specified postprocedural states: R11.2

## 2023-10-28 SURGERY — EXAM UNDER ANESTHESIA, RECTUM
Anesthesia: Monitor Anesthesia Care | Site: Rectum

## 2023-10-28 MED ORDER — PROPOFOL 10 MG/ML IV BOLUS
INTRAVENOUS | Status: DC | PRN
  Administered 2023-10-28: 20 mg via INTRAVENOUS

## 2023-10-28 MED ORDER — MIDAZOLAM HCL 2 MG/2ML IJ SOLN
INTRAMUSCULAR | Status: AC
  Filled 2023-10-28: qty 2

## 2023-10-28 MED ORDER — MIDAZOLAM HCL 2 MG/2ML IJ SOLN
INTRAMUSCULAR | Status: DC | PRN
  Administered 2023-10-28: 2 mg via INTRAVENOUS

## 2023-10-28 MED ORDER — SODIUM CHLORIDE 0.9% FLUSH: 3.0000 mL | Freq: Two times a day (BID) | INTRAVENOUS | Status: DC

## 2023-10-28 MED ORDER — TRAMADOL HCL 50 MG PO TABS: 50.0000 mg | ORAL_TABLET | Freq: Four times a day (QID) | ORAL | 0 refills | Status: DC | PRN

## 2023-10-28 MED ORDER — SODIUM CHLORIDE 0.9 % IV SOLN: INTRAVENOUS | Status: DC

## 2023-10-28 MED ORDER — FENTANYL CITRATE (PF) 250 MCG/5ML IJ SOLN
INTRAMUSCULAR | Status: DC | PRN
  Administered 2023-10-28: 25 ug via INTRAVENOUS
  Administered 2023-10-28: 50 ug via INTRAVENOUS
  Administered 2023-10-28: 25 ug via INTRAVENOUS

## 2023-10-28 MED ORDER — ONDANSETRON HCL 4 MG/2ML IJ SOLN
INTRAMUSCULAR | Status: AC
  Filled 2023-10-28: qty 2

## 2023-10-28 MED ORDER — PROPOFOL 500 MG/50ML IV EMUL
INTRAVENOUS | Status: DC | PRN
  Administered 2023-10-28: 100 ug/kg/min via INTRAVENOUS

## 2023-10-28 MED ORDER — ONDANSETRON HCL 4 MG/2ML IJ SOLN
INTRAMUSCULAR | Status: DC | PRN
  Administered 2023-10-28: 4 mg via INTRAVENOUS

## 2023-10-28 MED ORDER — TRAMADOL HCL 50 MG PO TABS: 50.0000 mg | ORAL_TABLET | Freq: Four times a day (QID) | ORAL | 0 refills | Status: AC | PRN

## 2023-10-28 MED ORDER — PROPOFOL 1000 MG/100ML IV EMUL
INTRAVENOUS | Status: AC
  Filled 2023-10-28: qty 100

## 2023-10-28 MED ORDER — LIDOCAINE 2% (20 MG/ML) 5 ML SYRINGE
INTRAMUSCULAR | Status: DC | PRN
  Administered 2023-10-28: 40 mg via INTRAVENOUS

## 2023-10-28 MED ORDER — BUPIVACAINE-EPINEPHRINE 0.5% -1:200000 IJ SOLN
INTRAMUSCULAR | Status: DC | PRN
  Administered 2023-10-28: 30 mL

## 2023-10-28 MED ORDER — ACETAMINOPHEN 325 MG PO TABS
325.0000 mg | ORAL_TABLET | ORAL | Status: DC | PRN
Start: 2023-10-28 — End: 2023-10-28

## 2023-10-28 MED ORDER — OXYCODONE HCL 5 MG/5ML PO SOLN
5.0000 mg | Freq: Once | ORAL | Status: DC | PRN
Start: 2023-10-28 — End: 2023-10-28

## 2023-10-28 MED ORDER — PROPOFOL 10 MG/ML IV BOLUS
INTRAVENOUS | Status: AC
  Filled 2023-10-28: qty 20

## 2023-10-28 MED ORDER — 0.9 % SODIUM CHLORIDE (POUR BTL) OPTIME
TOPICAL | Status: DC | PRN
  Administered 2023-10-28: 500 mL

## 2023-10-28 MED ORDER — ACETAMINOPHEN 10 MG/ML IV SOLN
1000.0000 mg | Freq: Once | INTRAVENOUS | Status: DC | PRN
Start: 2023-10-28 — End: 2023-10-28

## 2023-10-28 MED ORDER — ACETAMINOPHEN 500 MG PO TABS
1000.0000 mg | ORAL_TABLET | ORAL | Status: AC
  Administered 2023-10-28: 1000 mg via ORAL

## 2023-10-28 MED ORDER — GABAPENTIN 300 MG PO CAPS
ORAL_CAPSULE | ORAL | Status: AC
  Filled 2023-10-28: qty 1

## 2023-10-28 MED ORDER — DROPERIDOL 2.5 MG/ML IJ SOLN: 0.6250 mg | Freq: Once | INTRAMUSCULAR | Status: DC | PRN

## 2023-10-28 MED ORDER — LIDOCAINE HCL (PF) 2 % IJ SOLN
INTRAMUSCULAR | Status: AC
  Filled 2023-10-28: qty 5

## 2023-10-28 MED ORDER — ACETAMINOPHEN 160 MG/5ML PO SOLN: 325.0000 mg | ORAL | Status: DC | PRN

## 2023-10-28 MED ORDER — LACTATED RINGERS IV SOLN: INTRAVENOUS | Status: DC

## 2023-10-28 MED ORDER — SCOPOLAMINE 1 MG/3DAYS TD PT72: 1.0000 | MEDICATED_PATCH | TRANSDERMAL | Status: DC

## 2023-10-28 MED ORDER — FENTANYL CITRATE (PF) 100 MCG/2ML IJ SOLN: 25.0000 ug | INTRAMUSCULAR | Status: DC | PRN

## 2023-10-28 MED ORDER — GABAPENTIN 300 MG PO CAPS
300.0000 mg | ORAL_CAPSULE | ORAL | Status: AC
  Administered 2023-10-28: 300 mg via ORAL

## 2023-10-28 MED ORDER — KETOROLAC TROMETHAMINE 30 MG/ML IJ SOLN
INTRAMUSCULAR | Status: AC
  Filled 2023-10-28: qty 1

## 2023-10-28 MED ORDER — BUPIVACAINE LIPOSOME 1.3 % IJ SUSP
INTRAMUSCULAR | Status: DC | PRN
  Administered 2023-10-28: 20 mL

## 2023-10-28 MED ORDER — FENTANYL CITRATE (PF) 100 MCG/2ML IJ SOLN
INTRAMUSCULAR | Status: AC
  Filled 2023-10-28: qty 2

## 2023-10-28 MED ORDER — ACETAMINOPHEN 500 MG PO TABS
ORAL_TABLET | ORAL | Status: AC
  Filled 2023-10-28: qty 2

## 2023-10-28 MED ORDER — OXYCODONE HCL 5 MG PO TABS: 5.0000 mg | ORAL_TABLET | Freq: Once | ORAL | Status: DC | PRN

## 2023-10-28 SURGICAL SUPPLY — 58 items
BENZOIN TINCTURE PRP APPL 2/3 (GAUZE/BANDAGES/DRESSINGS) ×3 IMPLANT
BLADE EXTENDED COATED 6.5IN (ELECTRODE) IMPLANT
BLADE SURG 10 STRL SS (BLADE) IMPLANT
BLADE SURG 15 STRL LF DISP TIS (BLADE) ×2 IMPLANT
BLADE SURG 15 STRL SS (BLADE) ×2
BRIEF MESH DISP LRG (UNDERPADS AND DIAPERS) ×2 IMPLANT
COVER BACK TABLE 60X90IN (DRAPES) ×2 IMPLANT
COVER MAYO STAND STRL (DRAPES) ×2 IMPLANT
DRAPE HYSTEROSCOPY (MISCELLANEOUS) IMPLANT
DRAPE LAPAROTOMY 100X72 PEDS (DRAPES) ×2 IMPLANT
DRAPE SHEET LG 3/4 BI-LAMINATE (DRAPES) IMPLANT
DRAPE UTILITY XL STRL (DRAPES) ×2 IMPLANT
ELECT REM PT RETURN 9FT ADLT (ELECTROSURGICAL) ×2
ELECTRODE REM PT RTRN 9FT ADLT (ELECTROSURGICAL) ×1 IMPLANT
GAUZE 4X4 16PLY ~~LOC~~+RFID DBL (SPONGE) ×1 IMPLANT
GAUZE PAD ABD 8X10 STRL (GAUZE/BANDAGES/DRESSINGS) ×2 IMPLANT
GAUZE SPONGE 4X4 12PLY STRL (GAUZE/BANDAGES/DRESSINGS) ×1 IMPLANT
GAUZE SPONGE 4X4 12PLY STRL LF (GAUZE/BANDAGES/DRESSINGS) ×1 IMPLANT
GLOVE BIO SURGEON STRL SZ 6.5 (GLOVE) ×2 IMPLANT
GLOVE BIOGEL PI IND STRL 7.0 (GLOVE) ×2 IMPLANT
GLOVE INDICATOR 6.5 STRL GRN (GLOVE) ×2 IMPLANT
GOWN STRL REUS W/TWL XL LVL3 (GOWN DISPOSABLE) ×2 IMPLANT
HYDROGEN PEROXIDE 16OZ (MISCELLANEOUS) ×1 IMPLANT
IV CATH 14GX2 1/4 (CATHETERS) ×1 IMPLANT
IV CATH 18G SAFETY (IV SOLUTION) ×1 IMPLANT
KIT SIGMOIDOSCOPE (SET/KITS/TRAYS/PACK) IMPLANT
KIT TURNOVER CYSTO (KITS) ×2 IMPLANT
LEGGING LITHOTOMY PAIR STRL (DRAPES) IMPLANT
LOOP VASCLR MAXI BLUE 18IN ST (MISCELLANEOUS) IMPLANT
NDL HYPO 22X1.5 SAFETY MO (MISCELLANEOUS) ×1 IMPLANT
NDL SAFETY ECLIPSE 18X1.5 (NEEDLE) IMPLANT
NEEDLE HYPO 22X1.5 SAFETY MO (MISCELLANEOUS) ×2
NS IRRIG 500ML POUR BTL (IV SOLUTION) ×2 IMPLANT
PACK BASIN DAY SURGERY FS (CUSTOM PROCEDURE TRAY) ×2 IMPLANT
PAD ARMBOARD 7.5X6 YLW CONV (MISCELLANEOUS) IMPLANT
PENCIL SMOKE EVACUATOR (MISCELLANEOUS) ×2 IMPLANT
SLEEVE SCD COMPRESS KNEE MED (STOCKING) ×2 IMPLANT
SPIKE FLUID TRANSFER (MISCELLANEOUS) ×1 IMPLANT
SPONGE HEMORRHOID 8X3CM (HEMOSTASIS) IMPLANT
SPONGE SURGIFOAM ABS GEL 12-7 (HEMOSTASIS) IMPLANT
SUCTION TUBE FRAZIER 10FR DISP (SUCTIONS) IMPLANT
SUT CHROMIC 2 0 SH (SUTURE) ×1 IMPLANT
SUT CHROMIC 3 0 SH 27 (SUTURE) IMPLANT
SUT ETHIBOND 0 (SUTURE) IMPLANT
SUT VIC AB 2-0 SH 27 (SUTURE)
SUT VIC AB 2-0 SH 27XBRD (SUTURE) IMPLANT
SUT VIC AB 3-0 SH 18 (SUTURE) IMPLANT
SUT VIC AB 3-0 SH 27 (SUTURE)
SUT VIC AB 3-0 SH 27XBRD (SUTURE) IMPLANT
SYR CONTROL 10ML LL (SYRINGE) ×2 IMPLANT
TOWEL OR 17X24 6PK STRL BLUE (TOWEL DISPOSABLE) ×2 IMPLANT
TRAP FLUID SMOKE EVACUATOR (MISCELLANEOUS) ×1 IMPLANT
TRAY DSU PREP LF (CUSTOM PROCEDURE TRAY) ×2 IMPLANT
TUBE CONNECTING 12X1/4 (SUCTIONS) ×2 IMPLANT
UNDERPAD 30X36 HEAVY ABSORB (UNDERPADS AND DIAPERS) ×2 IMPLANT
VASCULAR TIE MAXI BLUE 18IN ST (MISCELLANEOUS)
WATER STERILE IRR 500ML POUR (IV SOLUTION) ×1 IMPLANT
YANKAUER SUCT BULB TIP NO VENT (SUCTIONS) ×2 IMPLANT

## 2023-10-28 NOTE — Discharge Instructions (Addendum)
ANORECTAL SURGERY: POST OP INSTRUCTIONS Take your usually prescribed home medications unless otherwise directed. DIET: During the first few hours after surgery sip on some liquids until you are able to urinate.  It is normal to not urinate for several hours after this surgery.  If you feel uncomfortable, please contact the office for instructions.  After you are able to urinate,you may eat, if you feel like it.  Follow a light bland diet the first 24 hours after arrival home, such as soup, liquids, crackers, etc.  Be sure to include lots of fluids daily (6-8 glasses).  Avoid fast food or heavy meals, as your are more likely to get nauseated.  Eat a low fat diet the next few days after surgery.  Limit caffeine intake to 1-2 servings a day. PAIN CONTROL: Pain is best controlled by a usual combination of several different methods TOGETHER: Muscle relaxation: Soak in a warm bath (or Sitz bath) three times a day and after bowel movements.  Continue to do this until all pain is resolved. Over the counter pain medication Prescription pain medication Most patients will experience some swelling and discomfort in the anus/rectal area and incisions.  Heat such as warm towels, sitz baths, warm baths, etc to help relax tight/sore spots and speed recovery.  Some people prefer to use ice, especially in the first couple days after surgery, as it may decrease the pain and swelling, or alternate between ice & heat.  Experiment to what works for you.  Swelling and bruising can take several weeks to resolve.  Pain can take even longer to completely resolve. It is helpful to take an over-the-counter pain medication regularly for the first few weeks.  Choose one of the following that works best for you: Naproxen (Aleve, etc)  Two 220mg tabs twice a day Ibuprofen (Advil, etc) Three 200mg tabs four times a day (every meal & bedtime) A  prescription for pain medication (such as percocet, oxycodone, hydrocodone, etc) should be  given to you upon discharge.  Take your pain medication as prescribed.  If you are having problems/concerns with the prescription medicine (does not control pain, nausea, vomiting, rash, itching, etc), please call us [phone] to see if we need to switch you to a different pain medicine that will work better for you and/or control your side effect better. If you need a refill on your pain medication, please contact your pharmacy.  They will contact our office to request authorization. Prescriptions will not be filled after 5 pm or on week-ends. KEEP YOUR BOWELS REGULAR and AVOID CONSTIPATION The goal is one to two soft bowel movements a day.  You should at least have a bowel movement every other day. Avoid getting constipated.  Between the surgery and the pain medications, it is common to experience some constipation. This can be very painful after rectal surgery.  Increasing fluid intake and taking a fiber supplement (such as Metamucil, Citrucel, FiberCon, etc) 1-2 times a day regularly will usually help prevent this problem from occurring.  A stool softener like colace is also recommended.  This can be purchased over the counter at your pharmacy.  You can take it up to 3 times a day.  If you do not have a bowel movement after 24 hrs since your surgery, take one does of milk of magnesia.  If you still haven't had a bowel movement 8-12 hours after that dose, take another dose.  If you don't have a bowel movement 48 hrs after surgery,   purchase a Fleets enema from the drug store and administer gently per package instructions.  If you still are having trouble with your bowel movements after that, please call the office for further instructions. If you develop diarrhea or have many loose bowel movements, simplify your diet to bland foods & liquids for a few days.  Stop any stool softeners and decrease your fiber supplement.  Switching to mild anti-diarrheal medications (Kayopectate, Pepto Bismol) can help.   If this worsens or does not improve, please call us.  Wound Care Remove your bandages before your first bowel movement or 8 hours after surgery.     Remove any wound packing material at this tim,e as well.  You do not need to repack the wound unless instructed otherwise.  Wear an absorbent pad or soft cotton gauze in your underwear to catch any drainage and help keep the area clean. You should change this every 2-3 hours while awake. Keep the area clean and dry.  Bathe / shower every day, especially after bowel movements.  Keep the area clean by showering / bathing over the incision / wound.   It is okay to soak an open wound to help wash it.  Wet wipes or showers / gentle washing after bowel movements is often less traumatic than regular toilet paper. You may have some styrofoam-like soft packing in the rectum which will come out with the first bowel movement.  You will often notice bleeding with bowel movements.  This should slow down by the end of the first week of surgery Expect some drainage.  This should slow down, too, by the end of the first week of surgery.  Wear an absorbent pad or soft cotton gauze in your underwear until the drainage stops. Do Not sit on a rubber or pillow ring.  This can make you symptoms worse.  You may sit on a soft pillow if needed.  ACTIVITIES as tolerated:   You may resume regular (light) daily activities beginning the next day--such as daily self-care, walking, climbing stairs--gradually increasing activities as tolerated.  If you can walk 30 minutes without difficulty, it is safe to try more intense activity such as jogging, treadmill, bicycling, low-impact aerobics, swimming, etc. Save the most intensive and strenuous activity for last such as sit-ups, heavy lifting, contact sports, etc  Refrain from any heavy lifting or straining until you are off narcotics for pain control.   You may drive when you are no longer taking prescription pain medication, you can  comfortably sit for long periods of time, and you can safely maneuver your car and apply brakes. You may have sexual intercourse when it is comfortable.  FOLLOW UP in our office Please call [facility] at [phone] to set up an appointment to see your surgeon in the office for a follow-up appointment approximately 3-4 weeks after your surgery. Make sure that you call for this appointment the day you arrive home to insure a convenient appointment time. 10. IF YOU HAVE DISABILITY OR FAMILY LEAVE FORMS, BRING THEM TO THE OFFICE FOR PROCESSING.  DO NOT GIVE THEM TO YOUR DOCTOR.     WHEN TO CALL US [phone]: Poor pain control Reactions / problems with new medications (rash/itching, nausea, etc)  Fever over 101.5 F (38.5 C) Inability to urinate Nausea and/or vomiting Worsening swelling or bruising Continued bleeding from incision. Increased pain, redness, or drainage from the incision  The clinic staff is available to answer your questions during regular business hours (8:30am-5pm).    Please don't hesitate to call and ask to speak to one of our nurses for clinical concerns.   A surgeon from [facility] is always on call at the hospitals   If you have a medical emergency, go to the nearest emergency room or call 911.    [facility] [address], [address], [place], [place]  [address] ? MAIN: [phone] ? TOLL FREE: [phone] ? FAX [phone] [email or web address]    Post Anesthesia Home Care Instructions  Activity: Get plenty of rest for the remainder of the day. A responsible individual must stay with you for 24 hours following the procedure.  For the next 24 hours, DO NOT: -Drive a car -Operate machinery -Drink alcoholic beverages -Take any medication unless instructed by your physician -Make any legal decisions or sign important papers.  Meals: Start with liquid foods such as gelatin or soup. Progress to regular foods  as tolerated. Avoid greasy, spicy, heavy foods. If nausea and/or vomiting occur, drink only clear liquids until the nausea and/or vomiting subsides. Call your physician if vomiting continues.  Special Instructions/Symptoms: Your throat may feel dry or sore from the anesthesia or the breathing tube placed in your throat during surgery. If this causes discomfort, gargle with warm salt water. The discomfort should disappear within 24 hours.  If you had a scopolamine patch placed behind your ear for the management of post- operative nausea and/or vomiting:  1. The medication in the patch is effective for 72 hours, after which it should be removed.  Wrap patch in a tissue and discard in the trash. Wash hands thoroughly with soap and water. 2. You may remove the patch earlier than 72 hours if you experience unpleasant side effects which may include dry mouth, dizziness or visual disturbances. 3. Avoid touching the patch. Wash your hands with soap and water after contact with the patch.     

## 2023-10-28 NOTE — Op Note (Signed)
10/28/2023  8:03 AM  PATIENT:  Aimee Cook  52 y.o. female  Patient Care Team: Maxie Better, MD as PCP - General (Obstetrics and Gynecology) Maris Berger, MD as Consulting Physician (Ophthalmology)  PRE-OPERATIVE DIAGNOSIS:  ANAL PAIN  POST-OPERATIVE DIAGNOSIS:  anal mucosal inflammation  PROCEDURE:   RECTAL EXAM UNDER ANESTHESIA EXCISIONAL BIOPSY   Surgeon(s): Romie Levee, MD  ASSISTANT: none   ANESTHESIA:   local and MAC  SPECIMEN:  Source of Specimen:  anterior anal canal inflammatory tissue  DISPOSITION OF SPECIMEN:  PATHOLOGY  COUNTS:  YES  PLAN OF CARE: Discharge to home after PACU  PATIENT DISPOSITION:  PACU - hemodynamically stable.  INDICATION: 52 y.o. F with intermittent anal pain    OR FINDINGS: prolapsing inflammatory tissue anterior midline  DESCRIPTION: the patient was identified in the preoperative holding area and taken to the OR where they were laid on the operating room table.  MAC anesthesia was induced without difficulty. The patient was then positioned in prone jackknife position with buttocks gently taped apart.  The patient was then prepped and draped in usual sterile fashion.  SCDs were noted to be in place prior to the initiation of anesthesia. A surgical timeout was performed indicating the correct patient, procedure, positioning and need for preoperative antibiotics.  A rectal block was performed using Marcaine with epinephrine mixed with Experel.    I began with a digital rectal exam.  The anal canal was gently dilated to approximately 2 fingerbreadths.  There was no sphincter hypertension present.  There was no fissures noted.  There were no masses or fluctuance concerning for intersphincteric abscess.  I then placed a Hill-Ferguson anoscope into the anal canal and evaluated this completely.  The patient had minimal hemorrhoid disease which was noninflammatory.  Patient did have some prolapsing tissue in the left anterior  midline that appeared inflamed and easily friable.  This was dissected free from the sphincter complex and excised.  It was sent to pathology for further examination.  The excision was then closed with a running 2-0 chromic suture.  Additional Marcaine mixed with Exparel was placed around the incision for postoperative pain control.  Hemostasis was good.  A dressing was applied and the patient was awakened from anesthesia and sent to the postanesthesia care unit in stable condition.  All counts were correct per operating room staff.  Vanita Panda, MD  Colorectal and General Surgery [facility]

## 2023-10-28 NOTE — H&P (Signed)
REFERRING PHYSICIAN:  Self   PROVIDER:  Elenora Gamma, MD   MRN: [id number] DOB: [birth year]    Subjective    Chief Complaint: Follow-up ( NEW PROBLEM - hems,)       History of Present Illness: Aimee Cook is a 52 y.o. female who is seen today as an office consultation at the request of Dr. Seymour Bars for evaluation of Follow-up ( NEW PROBLEM - hems,) .  Patient reports occasional anal discomfort and 1 episode of rectal bleeding after moving some heavy furniture.  She states that she has had problems with irregular bowel habits for approximately 10 years but most of these symptoms are kept in check with dietary modifications and probiotics.  She was seen in the office and noted to have grade 1 hemorrhoids.  I recommended fiber management.  She continues to have episodic issues with pain and bleeding.  She reports pain with bowel movements and intercourse.  She is using fiber supplementation on a daily basis.  She is having soft bowel movements.       Review of Systems: A complete review of systems was obtained from the patient.  I have reviewed this information and discussed as appropriate with the patient.  See HPI as well for other ROS.       Medical History:     Past Medical History:  Diagnosis Date   Anemia     Asthma, unspecified asthma severity, unspecified whether complicated, unspecified whether persistent ([facility])        There is no problem list on file for this patient.          Past Surgical History:  Procedure Laterality Date   REPAIR COMPLEX RETINAL DETACHMENT       SPINE SURGERY               Allergies  Allergen Reactions   Erythromycin Other (See Comments) and Unknown      GI discomfort and almost paralyzed patient     Iodine Anaphylaxis   Aspirin Other (See Comments) and Unknown      Childhood allergy              Current Outpatient Medications on File Prior to Visit  Medication Sig Dispense Refill   beclomethasone dipropionate  (BECLOMETHASONE DIPROP, REFILL, INHAL) Inhale into the lungs       cycloSPORINE (RESTASIS) 0.05 % ophthalmic emulsion Apply 1 drop to eye 2 (two) times daily       tacrolimus (PROTOPIC) 0.03 % ointment          No current facility-administered medications on file prior to visit.           Family History  Problem Relation Age of Onset   Coronary Artery Disease (Blocked arteries around heart) Father     Diabetes Father     Skin cancer Maternal Aunt     Breast cancer Maternal Grandfather     Bipolar disorder Paternal Grandmother     Bipolar disorder Other        Social History       Tobacco Use  Smoking Status Never  Smokeless Tobacco Never      Social History        Socioeconomic History   Marital status: Married  Tobacco Use   Smoking status: Never   Smokeless tobacco: Never  Vaping Use   Vaping status: Never Used  Substance and Sexual Activity   Alcohol use: Yes   Drug use: Never    Allergies  Allergies  Allergen Reactions   Erythromycin Other (See Comments) and Unknown      GI discomfort and almost paralyzed patient     Iodine Anaphylaxis   Aspirin Other (See Comments) and Unknown      Childhood allergy          Objective:      Vitals:   10/28/23 0620  BP: (!) 117/59  Pulse: 72  Resp: 17  Temp: 98 F (36.7 C)  SpO2: 99%      Exam Gen: NAD Abd: soft Rectal: good tone, significant inflammatory response in the anterior anal canal distal to the dentate line.  Min skin tag     Labs, Imaging and Diagnostic Testing:         Assessment and Plan:  Diagnoses and all orders for this visit:   Anal irritation     On exam today she has some inflammatory tissue that is tender to palpation.  I believe this is the source of her symptoms.  We discussed performing an exam under anesthesia to evaluate this more closely as it is too tender to exam evaluate completely here in the office.  If this is determined to be a fissure we will excise the  inflammatory tissue and inject Botox.  If it is determined to be a intersphincteric fistula, we will open this to help relieve her discomfort and allow this area to heal.  If nothing else is found we will excise the inflammatory tissue.  This was discussed with her in detail.  All questions were answered.  Patient agreeable to this plan.   Vanita Panda, MD Colon and Rectal Surgery [facility]

## 2023-10-28 NOTE — Transfer of Care (Signed)
Immediate Anesthesia Transfer of Care Note  Patient: Aimee Cook  Procedure(s) Performed: RECTAL EXAM UNDER ANESTHESIA (Rectum) EXCISIONAL BIOPSY (Rectum)  Patient Location: PACU  Anesthesia Type:MAC  Level of Consciousness: awake, alert , and oriented  Airway & Oxygen Therapy: Patient Spontanous Breathing  Post-op Assessment: Report given to RN and Post -op Vital signs reviewed and stable  Post vital signs: Reviewed and stable  Last Vitals:  Vitals Value Taken Time  BP 124/65 10/28/23 0808  Temp    Pulse 110 10/28/23 0809  Resp 19 10/28/23 0809  SpO2 99 % 10/28/23 0809  Vitals shown include unfiled device data.  Last Pain:  Vitals:   10/28/23 0620  TempSrc: Oral  PainSc: 0-No pain         Complications: No notable events documented.

## 2023-10-28 NOTE — Anesthesia Postprocedure Evaluation (Signed)
Anesthesia Post Note  Patient: Aimee Cook  Procedure(s) Performed: RECTAL EXAM UNDER ANESTHESIA (Rectum) EXCISIONAL BIOPSY (Rectum)     Patient location during evaluation: PACU Anesthesia Type: MAC Level of consciousness: awake and alert Pain management: pain level controlled Vital Signs Assessment: post-procedure vital signs reviewed and stable Respiratory status: spontaneous breathing, nonlabored ventilation, respiratory function stable and patient connected to nasal cannula oxygen Cardiovascular status: stable and blood pressure returned to baseline Postop Assessment: no apparent nausea or vomiting Anesthetic complications: no  No notable events documented.  Last Vitals:  Vitals:   10/28/23 0830 10/28/23 0920  BP: 123/66 123/64  Pulse: 84 67  Resp: 16 16  Temp:  36.5 C  SpO2: 100% 100%    Last Pain:  Vitals:   10/28/23 0920  TempSrc:   PainSc: 0-No pain                 Shelton Silvas

## 2023-10-28 NOTE — Anesthesia Preprocedure Evaluation (Addendum)
Anesthesia Evaluation  Patient identified by MRN, date of birth, ID band Patient awake    Reviewed: Allergy & Precautions, NPO status , Patient's Chart, lab work & pertinent test results  History of Anesthesia Complications (+) PONV and history of anesthetic complications  Airway Mallampati: II  TM Distance: >3 FB Neck ROM: Full    Dental  (+) Teeth Intact, Dental Advisory Given   Pulmonary asthma    breath sounds clear to auscultation       Cardiovascular negative cardio ROS  Rhythm:Regular Rate:Normal     Neuro/Psych negative neurological ROS  negative psych ROS   GI/Hepatic negative GI ROS, Neg liver ROS,,,  Endo/Other  negative endocrine ROS    Renal/GU negative Renal ROS     Musculoskeletal negative musculoskeletal ROS (+)    Abdominal   Peds  Hematology  (+) Blood dyscrasia, anemia   Anesthesia Other Findings   Reproductive/Obstetrics                             Anesthesia Physical Anesthesia Plan  ASA: 2  Anesthesia Plan: MAC   Post-op Pain Management: Tylenol PO (pre-op)* and Toradol IV (intra-op)*   Induction: Intravenous  PONV Risk Score and Plan: 4 or greater and Ondansetron, Dexamethasone, Midazolam and Propofol infusion  Airway Management Planned: Natural Airway and Nasal Cannula  Additional Equipment: None  Intra-op Plan:   Post-operative Plan:   Informed Consent: I have reviewed the patients History and Physical, chart, labs and discussed the procedure including the risks, benefits and alternatives for the proposed anesthesia with the patient or authorized representative who has indicated his/her understanding and acceptance.       Plan Discussed with: CRNA  Anesthesia Plan Comments:        Anesthesia Quick Evaluation

## 2023-11-01 ENCOUNTER — Encounter (HOSPITAL_BASED_OUTPATIENT_CLINIC_OR_DEPARTMENT_OTHER): Payer: Self-pay | Admitting: General Surgery

## 2023-11-01 LAB — SURGICAL PATHOLOGY

## 2023-11-16 DIAGNOSIS — H04123 Dry eye syndrome of bilateral lacrimal glands: Secondary | ICD-10-CM | POA: Diagnosis not present

## 2023-11-16 DIAGNOSIS — H33303 Unspecified retinal break, bilateral: Secondary | ICD-10-CM | POA: Diagnosis not present

## 2023-11-16 DIAGNOSIS — H5213 Myopia, bilateral: Secondary | ICD-10-CM | POA: Diagnosis not present

## 2023-11-22 DIAGNOSIS — K6289 Other specified diseases of anus and rectum: Secondary | ICD-10-CM | POA: Diagnosis not present

## 2023-12-20 DIAGNOSIS — D225 Melanocytic nevi of trunk: Secondary | ICD-10-CM | POA: Diagnosis not present

## 2023-12-20 DIAGNOSIS — L578 Other skin changes due to chronic exposure to nonionizing radiation: Secondary | ICD-10-CM | POA: Diagnosis not present

## 2023-12-20 DIAGNOSIS — L821 Other seborrheic keratosis: Secondary | ICD-10-CM | POA: Diagnosis not present

## 2023-12-20 DIAGNOSIS — L814 Other melanin hyperpigmentation: Secondary | ICD-10-CM | POA: Diagnosis not present

## 2023-12-20 DIAGNOSIS — L820 Inflamed seborrheic keratosis: Secondary | ICD-10-CM | POA: Diagnosis not present

## 2024-02-09 DIAGNOSIS — Z01419 Encounter for gynecological examination (general) (routine) without abnormal findings: Secondary | ICD-10-CM | POA: Diagnosis not present

## 2024-02-15 DIAGNOSIS — N83201 Unspecified ovarian cyst, right side: Secondary | ICD-10-CM | POA: Diagnosis not present

## 2024-02-15 DIAGNOSIS — N83209 Unspecified ovarian cyst, unspecified side: Secondary | ICD-10-CM | POA: Diagnosis not present

## 2024-02-15 DIAGNOSIS — N898 Other specified noninflammatory disorders of vagina: Secondary | ICD-10-CM | POA: Diagnosis not present

## 2024-03-16 DIAGNOSIS — N83201 Unspecified ovarian cyst, right side: Secondary | ICD-10-CM | POA: Diagnosis not present

## 2024-03-16 DIAGNOSIS — R1900 Intra-abdominal and pelvic swelling, mass and lump, unspecified site: Secondary | ICD-10-CM | POA: Diagnosis not present

## 2024-04-04 DIAGNOSIS — Z01818 Encounter for other preprocedural examination: Secondary | ICD-10-CM | POA: Diagnosis not present

## 2024-04-04 DIAGNOSIS — Z01812 Encounter for preprocedural laboratory examination: Secondary | ICD-10-CM | POA: Diagnosis not present

## 2024-04-04 DIAGNOSIS — R1900 Intra-abdominal and pelvic swelling, mass and lump, unspecified site: Secondary | ICD-10-CM | POA: Diagnosis not present

## 2024-04-18 DIAGNOSIS — N858 Other specified noninflammatory disorders of uterus: Secondary | ICD-10-CM | POA: Diagnosis not present

## 2024-04-18 DIAGNOSIS — D270 Benign neoplasm of right ovary: Secondary | ICD-10-CM | POA: Diagnosis not present

## 2024-04-18 DIAGNOSIS — N83292 Other ovarian cyst, left side: Secondary | ICD-10-CM | POA: Diagnosis not present

## 2024-04-18 DIAGNOSIS — J45909 Unspecified asthma, uncomplicated: Secondary | ICD-10-CM | POA: Diagnosis not present

## 2024-04-18 DIAGNOSIS — R1900 Intra-abdominal and pelvic swelling, mass and lump, unspecified site: Secondary | ICD-10-CM | POA: Diagnosis not present

## 2024-04-18 DIAGNOSIS — Z91013 Allergy to seafood: Secondary | ICD-10-CM | POA: Diagnosis not present

## 2024-04-18 DIAGNOSIS — Z91041 Radiographic dye allergy status: Secondary | ICD-10-CM | POA: Diagnosis not present

## 2024-04-18 DIAGNOSIS — Z9889 Other specified postprocedural states: Secondary | ICD-10-CM | POA: Diagnosis not present

## 2024-04-18 DIAGNOSIS — N888 Other specified noninflammatory disorders of cervix uteri: Secondary | ICD-10-CM | POA: Diagnosis not present

## 2024-04-18 DIAGNOSIS — R1909 Other intra-abdominal and pelvic swelling, mass and lump: Secondary | ICD-10-CM | POA: Diagnosis not present

## 2024-04-18 DIAGNOSIS — Z880 Allergy status to penicillin: Secondary | ICD-10-CM | POA: Diagnosis not present

## 2024-04-18 DIAGNOSIS — N83201 Unspecified ovarian cyst, right side: Secondary | ICD-10-CM | POA: Diagnosis not present

## 2024-04-18 DIAGNOSIS — Z888 Allergy status to other drugs, medicaments and biological substances status: Secondary | ICD-10-CM | POA: Diagnosis not present

## 2024-04-18 DIAGNOSIS — N838 Other noninflammatory disorders of ovary, fallopian tube and broad ligament: Secondary | ICD-10-CM | POA: Diagnosis not present

## 2024-05-02 DIAGNOSIS — R509 Fever, unspecified: Secondary | ICD-10-CM | POA: Diagnosis not present

## 2024-05-02 DIAGNOSIS — N200 Calculus of kidney: Secondary | ICD-10-CM | POA: Diagnosis not present

## 2024-05-02 DIAGNOSIS — N3000 Acute cystitis without hematuria: Secondary | ICD-10-CM | POA: Diagnosis not present

## 2024-05-02 DIAGNOSIS — R1900 Intra-abdominal and pelvic swelling, mass and lump, unspecified site: Secondary | ICD-10-CM | POA: Diagnosis not present

## 2024-05-02 DIAGNOSIS — Z90710 Acquired absence of both cervix and uterus: Secondary | ICD-10-CM | POA: Diagnosis not present

## 2024-05-02 DIAGNOSIS — R112 Nausea with vomiting, unspecified: Secondary | ICD-10-CM | POA: Diagnosis not present

## 2024-05-02 DIAGNOSIS — K529 Noninfective gastroenteritis and colitis, unspecified: Secondary | ICD-10-CM | POA: Diagnosis not present

## 2024-05-02 DIAGNOSIS — R197 Diarrhea, unspecified: Secondary | ICD-10-CM | POA: Diagnosis not present

## 2024-05-02 DIAGNOSIS — Z9889 Other specified postprocedural states: Secondary | ICD-10-CM | POA: Diagnosis not present

## 2024-05-03 DIAGNOSIS — Z90710 Acquired absence of both cervix and uterus: Secondary | ICD-10-CM | POA: Diagnosis not present

## 2024-05-03 DIAGNOSIS — N200 Calculus of kidney: Secondary | ICD-10-CM | POA: Diagnosis not present

## 2024-05-16 DIAGNOSIS — N390 Urinary tract infection, site not specified: Secondary | ICD-10-CM | POA: Diagnosis not present

## 2024-05-16 DIAGNOSIS — R300 Dysuria: Secondary | ICD-10-CM | POA: Diagnosis not present

## 2024-05-19 DIAGNOSIS — Z9889 Other specified postprocedural states: Secondary | ICD-10-CM | POA: Diagnosis not present

## 2024-06-05 DIAGNOSIS — E781 Pure hyperglyceridemia: Secondary | ICD-10-CM | POA: Diagnosis not present

## 2024-06-05 DIAGNOSIS — Z1212 Encounter for screening for malignant neoplasm of rectum: Secondary | ICD-10-CM | POA: Diagnosis not present

## 2024-06-12 DIAGNOSIS — Z1331 Encounter for screening for depression: Secondary | ICD-10-CM | POA: Diagnosis not present

## 2024-06-12 DIAGNOSIS — R82998 Other abnormal findings in urine: Secondary | ICD-10-CM | POA: Diagnosis not present

## 2024-06-12 DIAGNOSIS — Z0000 Encounter for general adult medical examination without abnormal findings: Secondary | ICD-10-CM | POA: Diagnosis not present

## 2024-06-12 DIAGNOSIS — M3500 Sicca syndrome, unspecified: Secondary | ICD-10-CM | POA: Diagnosis not present

## 2024-06-15 ENCOUNTER — Ambulatory Visit (INDEPENDENT_AMBULATORY_CARE_PROVIDER_SITE_OTHER): Admitting: Radiology

## 2024-06-15 ENCOUNTER — Ambulatory Visit
Admission: RE | Admit: 2024-06-15 | Discharge: 2024-06-15 | Disposition: A | Discharge status: Home / Self Care | Source: Ambulatory Visit

## 2024-06-15 VITALS — BP 119/68 | HR 82 | Temp 98.3°F | Resp 17 | Ht 65.0 in | Wt 150.0 lb

## 2024-06-15 DIAGNOSIS — S8992XA Unspecified injury of left lower leg, initial encounter: Secondary | ICD-10-CM

## 2024-06-15 DIAGNOSIS — M25562 Pain in left knee: Secondary | ICD-10-CM

## 2024-06-15 DIAGNOSIS — M25572 Pain in left ankle and joints of left foot: Secondary | ICD-10-CM | POA: Diagnosis not present

## 2024-06-15 NOTE — ED Triage Notes (Signed)
 Pt presents to UC for left knee and ankle pain since yesterday. Pt reports she was tackled by her 75 lbs dog. Has a knee brace.  Pt took ibuprofen last night and iced knee.

## 2024-06-15 NOTE — ED Provider Notes (Signed)
 [facility]    CSN: [id number] Arrival date & time: 06/15/24  0902      History   Chief Complaint Chief Complaint  Patient presents with   Leg Injury    Was tackled yesterday evening by my 75 pound dog. Left knee lower leg and ankle area are injured. Want to make sure nothing's broken. - Entered by patient    HPI Aimee Cook is a 53 y.o. female.   HPI  Pt presents today for concerns for left knee  She was tackled by her dog yesterday and reports associated knee pain and looseness  She also reports some concerns for left ankle pain but no swelling  She is wearing a knee brace and took ibuprofen 800 mg last night  She reports that she did fall during this injury but denies hitting her head, LOC, headache, confusion    Past Medical History:  Diagnosis Date   Anal pain    Anemia    mild   Asthma, mild    followed by pcp   Dental crowns present    Hemorrhoids    History of E. coli septicemia 01/1996   History of numbness    per pt  11/ 2023 after second or third covid vaccine shot ,  acute right eye pain, right facial numbness, ED visit CT/ MRI done showed inflammation trigiminal nerve , ? viral neuritis ? related to covid vaccine;  pt followed up w/ neurologist Dr Onita note in epic 11-12-2020,  pt stated at time of visit all symptoms resolved   History of retinal detachment    per pt no trauma, spontaneous bilateral retinal detachment twice   s/p  laser procedure in office spprox >5 yrs ago   PONV (postoperative nausea and vomiting)    Rosacea    Seasonal allergies    Wears glasses     Patient Active Problem List   Diagnosis Date Noted   Right facial numbness 11/12/2020   Congenital spondylolisthesis of lumbar region 11/25/2015    Past Surgical History:  Procedure Laterality Date   ABDOMINAL HYSTERECTOMY     DILATION AND CURETTAGE OF UTERUS  2012   RECTAL BIOPSY N/A 10/28/2023   Procedure: EXCISIONAL BIOPSY;  Surgeon: Debby Hila, MD;   Location: [facility];  Service: General;  Laterality: N/A;   RECTAL EXAM UNDER ANESTHESIA N/A 10/28/2023   Procedure: RECTAL EXAM UNDER ANESTHESIA;  Surgeon: Debby Hila, MD;  Location: [facility];  Service: General;  Laterality: N/A;   SPINAL FUSION  11/2015   WISDOM TOOTH EXTRACTION      OB History   No obstetric history on file.      Home Medications    Prior to Admission medications   Medication Sig Start Date End Date Taking? Authorizing Provider  cycloSPORINE  (RESTASIS ) 0.05 % ophthalmic emulsion Place 1 drop into both eyes 2 (two) times daily.   Yes [provider]  albuterol (VENTOLIN HFA) 108 (90 Base) MCG/ACT inhaler Inhale 1-2 puffs into the lungs every 6 (six) hours as needed for wheezing or shortness of breath.    [provider]  beclomethasone (QVAR) 80 MCG/ACT inhaler Inhale 1 puff into the lungs at bedtime as needed (wheezing).    [provider]  Estradiol 1 MG/GM GEL Place 1 mg vaginally.    [provider]  Flaxseed, Linseed, (FLAX SEED OIL) 1000 MG CAPS Take 1,000 mg by mouth daily.    [provider]  fluticasone  (  FLONASE ) 50 MCG/ACT nasal spray Place 1 spray into both nostrils daily for 10 days. Patient taking differently: Place 1 spray into both nostrils daily as needed. 07/23/22 10/26/23  Blaise Aleene KIDD, MD  Glucosamine 750 MG TABS Take 750 mg by mouth daily.    [provider]  ibuprofen (ADVIL) 200 MG tablet Take 400 mg by mouth every 6 (six) hours as needed.    [provider]  Multiple Vitamins-Calcium  (ONE-A-DAY WOMENS FORMULA PO) Take 1 tablet by mouth daily.    [provider]  polycarbophil (FIBERCON) 625 MG tablet Take 1,250 mg by mouth daily.    [provider]  Polyethyl Glycol-Propyl Glycol (SYSTANE PRESERVATIVE FREE OP) Apply 1 drop to eye 4 (four) times daily as needed (dry eyes).    [provider]  Probiotic Product  (PROBIOTIC PO) Take 1 capsule by mouth at bedtime.    [provider]  SODIUM FLUORIDE 5000 PLUS 1.1 % CREA dental cream Place 1 application onto teeth at bedtime.  10/17/20   [provider]  tacrolimus  (PROTOPIC ) 0.1 % ointment Apply 1 application topically at bedtime as needed (rash).     [provider]  traMADol  (ULTRAM ) 50 MG tablet Take 1-2 tablets (50-100 mg total) by mouth every 6 (six) hours as needed. 10/28/23   Debby Hila, MD    Family History Family History  Problem Relation Age of Onset   Ulcerative colitis Mother    Diabetes Father    Hypertension Father    Kidney Stones Father    Heart disease Father    Alzheimer's disease Maternal Grandmother    Colon cancer Maternal Grandfather    Breast cancer Paternal Grandmother    Lung cancer Paternal Grandfather    Breast cancer Paternal Aunt     Social History Social History   Tobacco Use   Smoking status: Never   Smokeless tobacco: Never  Vaping Use   Vaping status: Never Used  Substance Use Topics   Alcohol  use: Yes    Comment: socially   Drug use: Never     Allergies   Erythromycin, Iodine, Shellfish allergy, Lanolin, Sulfa antibiotics, and Aspirin   Review of Systems Review of Systems  Musculoskeletal:  Positive for arthralgias and myalgias.     Physical Exam Triage Vital Signs ED Triage Vitals  Encounter Vitals Group     BP 06/15/24 0909 119/68     Girls Systolic BP Percentile --      Girls Diastolic BP Percentile --      Boys Systolic BP Percentile --      Boys Diastolic BP Percentile --      Pulse Rate 06/15/24 0909 82     Resp 06/15/24 0909 17     Temp 06/15/24 0909 98.3 F (36.8 C)     Temp Source 06/15/24 0909 Oral     SpO2 06/15/24 0909 98 %     Weight 06/15/24 0909 150 lb (68 kg)     Height 06/15/24 0909 5' 5 (1.651 m)     Head Circumference --      Peak Flow --      Pain Score 06/15/24 0932 5     Pain Loc --      Pain Education --      Exclude from  Growth Chart --    No data found.  Updated Vital Signs BP 119/68 (BP Location: Right Arm)   Pulse 82   Temp 98.3 F (36.8 C) (Oral)   Resp  17   Ht 5' 5 (1.651 m)   Wt 150 lb (68 kg)   LMP 05/26/2020   SpO2 98%   BMI 24.96 kg/m   Visual Acuity Right Eye Distance:   Left Eye Distance:   Bilateral Distance:    Right Eye Near:   Left Eye Near:    Bilateral Near:     Physical Exam Vitals reviewed.  Constitutional:      General: She is awake.     Appearance: Normal appearance. She is well-developed and well-groomed.  HENT:     Head: Normocephalic and atraumatic.  Eyes:     General: Lids are normal. Gaze aligned appropriately.     Extraocular Movements: Extraocular movements intact.     Conjunctiva/sclera: Conjunctivae normal.  Cardiovascular:     Pulses:          Dorsalis pedis pulses are 2+ on the left side.       Posterior tibial pulses are 2+ on the left side.  Pulmonary:     Effort: Pulmonary effort is normal.  Musculoskeletal:     Left knee: No swelling, effusion, erythema or ecchymosis. Normal range of motion. No tenderness. No LCL laxity, MCL laxity, ACL laxity or PCL laxity.    Instability Tests: Anterior drawer test negative. Posterior drawer test negative. Medial McMurray test negative and lateral McMurray test negative.     Left ankle: Swelling present. No deformity, ecchymosis or lacerations. No tenderness. Normal range of motion. Anterior drawer test negative. Normal pulse.     Left Achilles Tendon: No tenderness. Thompson's test negative.       Legs:     Comments: Patient does not have obvious instability or laxity with  ACL, PCL, MCL, LCL testing on the left knee. No obvious swelling, bruising, effusion at time of exam.   Left ankle has mild lateral malleolus swelling but no bruising or erythema. She has some discomfort with ankle anterior drawer testing but normal ROM with plantar flexion, supination and pronation. She has pain with dorsiflexion and is  hesitant to try this movement.   Neurological:     Mental Status: She is alert and oriented to person, place, and time.  Psychiatric:        Attention and Perception: Attention and perception normal.        Mood and Affect: Mood and affect normal.        Speech: Speech normal.        Behavior: Behavior normal. Behavior is cooperative.      UC Treatments / Results  Labs (all labs ordered are listed, but only abnormal results are displayed) Labs Reviewed - No data to display  EKG   Radiology DG Tibia/Fibula Left Result Date: 06/15/2024 CLINICAL DATA:  Trauma to the left lower extremity. EXAM: LEFT TIBIA AND FIBULA - 2 VIEW COMPARISON:  None Available. FINDINGS: No acute fracture or dislocation. The bones are well mineralized. No arthritic changes. The soft tissues are unremarkable. IMPRESSION: Negative. Electronically Signed   By: Vanetta Chou M.D.   On: 06/15/2024 10:20    Procedures Procedures (including critical care time)  Medications Ordered in UC Medications - No data to display  Initial Impression / Assessment and Plan / UC Course  I have reviewed the triage vital signs and the nursing notes.  Pertinent labs & imaging results that were available during my care of the patient were reviewed by me and considered in my medical decision making (see chart for details).  Final Clinical Impressions(s) / UC Diagnoses   Final diagnoses:  Blunt trauma of left lower leg, initial encounter  Pain of joint of left ankle and foot  Acute pain of left knee   Pt presents today for concerns of left leg pain following a fall caused by her large dog running into her leg. She denies hitting her head, LOC, confusion or headache at this time. Physical exam is notable for lateral knee pain with predominant pain located just distal to the left knee along the lateral aspect. She also voices concerns for left ankle pain that is particularly prevalent with dorsiflexion. Given largely  intact ROM and negative findings with special tests on knee and ankle I am less suspicious for osseous injury or severe ligamentous injury. Will get Tib/Fib for rule out of potential Maisonneuve fracture = radiology review shows negative xray. Results discussed with patient during apt. Recommend continued home measures with knee and ankle brace, OTC medications as needed for pain relief, gentle stretches and massage as tolerated. If symptoms are not improving or worsening over the next 2 weeks recommend follow up with PCP or Ortho for further evaluation.      Discharge Instructions      Your imaging was negative for signs of fracture or dislocation.  To help with your recovery I recommend the following:  Rest Warm compresses to the area (20 minutes on, minimum of 30 minutes off) You can alternate Tylenol  and Ibuprofen for pain management but Ibuprofen is typically preferred to reduce inflammation.  Gentle stretches and exercises that I have included in your paperwork Try to reduce excess strain to the area and rest  Wear supportive shoes   If you feel like your symptoms are not improving or worsening in the next 2 weeks please follow up with Orthopedics for further management  [facility] [address], [place], [place] [address] [phone]  [facility] [address], [place], [place] [address]  [phone]       ED Prescriptions   None    PDMP not reviewed this encounter.   Marylene Rocky BRAVO, PA-C 06/15/24 1158

## 2024-06-15 NOTE — Discharge Instructions (Addendum)
 Your imaging was negative for signs of fracture or dislocation.  To help with your recovery I recommend the following:  Rest Warm compresses to the area (20 minutes on, minimum of 30 minutes off) You can alternate Tylenol  and Ibuprofen for pain management but Ibuprofen is typically preferred to reduce inflammation.  Gentle stretches and exercises that I have included in your paperwork Try to reduce excess strain to the area and rest  Wear supportive shoes   If you feel like your symptoms are not improving or worsening in the next 2 weeks please follow up with Orthopedics for further management  [facility] [address], [place], [place] [address] [phone]  [facility] [address] [place], [place] [address]  [phone]

## 2024-06-22 DIAGNOSIS — M25562 Pain in left knee: Secondary | ICD-10-CM | POA: Diagnosis not present

## 2024-06-28 DIAGNOSIS — M25562 Pain in left knee: Secondary | ICD-10-CM | POA: Diagnosis not present

## 2024-07-04 DIAGNOSIS — S82832A Other fracture of upper and lower end of left fibula, initial encounter for closed fracture: Secondary | ICD-10-CM | POA: Diagnosis not present

## 2024-08-01 DIAGNOSIS — L570 Actinic keratosis: Secondary | ICD-10-CM | POA: Diagnosis not present

## 2024-08-01 DIAGNOSIS — L820 Inflamed seborrheic keratosis: Secondary | ICD-10-CM | POA: Diagnosis not present

## 2024-08-17 ENCOUNTER — Other Ambulatory Visit: Payer: Self-pay | Admitting: Obstetrics and Gynecology

## 2024-08-17 DIAGNOSIS — Z1231 Encounter for screening mammogram for malignant neoplasm of breast: Secondary | ICD-10-CM

## 2024-09-05 DIAGNOSIS — Z23 Encounter for immunization: Secondary | ICD-10-CM | POA: Diagnosis not present

## 2024-10-02 ENCOUNTER — Ambulatory Visit
Admission: RE | Admit: 2024-10-02 | Discharge: 2024-10-02 | Disposition: A | Discharge status: Home / Self Care | Source: Ambulatory Visit | Attending: Obstetrics and Gynecology | Admitting: Obstetrics and Gynecology

## 2024-10-02 DIAGNOSIS — Z1231 Encounter for screening mammogram for malignant neoplasm of breast: Secondary | ICD-10-CM
